# Patient Record
Sex: Female | Born: 1975 | Race: Black or African American | Hispanic: No | Smoking: Never smoker
Health system: Southern US, Community
[De-identification: ages and names within clinical notes are randomized; demographics above are authoritative.]

## PROBLEM LIST (undated history)

## (undated) DIAGNOSIS — I10 Essential (primary) hypertension: Secondary | ICD-10-CM

## (undated) DIAGNOSIS — F32A Depression, unspecified: Secondary | ICD-10-CM

## (undated) DIAGNOSIS — D649 Anemia, unspecified: Secondary | ICD-10-CM

---

## 1999-02-19 ENCOUNTER — Emergency Department (HOSPITAL_COMMUNITY): Admission: EM | Admit: 1999-02-19 | Discharge: 1999-02-19 | Payer: Self-pay | Admitting: Emergency Medicine

## 1999-02-19 ENCOUNTER — Encounter: Payer: Self-pay | Admitting: Emergency Medicine

## 2002-12-14 ENCOUNTER — Emergency Department (HOSPITAL_COMMUNITY): Admission: EM | Admit: 2002-12-14 | Discharge: 2002-12-14 | Payer: Self-pay | Admitting: Emergency Medicine

## 2004-05-03 ENCOUNTER — Emergency Department (HOSPITAL_COMMUNITY): Admission: EM | Admit: 2004-05-03 | Discharge: 2004-05-03 | Payer: Self-pay | Admitting: Family Medicine

## 2006-04-28 ENCOUNTER — Other Ambulatory Visit: Admission: RE | Admit: 2006-04-28 | Discharge: 2006-04-28 | Payer: Self-pay | Admitting: Obstetrics and Gynecology

## 2006-11-20 ENCOUNTER — Inpatient Hospital Stay (HOSPITAL_COMMUNITY): Admission: AD | Admit: 2006-11-20 | Discharge: 2006-11-23 | Payer: Self-pay | Admitting: Obstetrics and Gynecology

## 2012-05-03 ENCOUNTER — Encounter (HOSPITAL_COMMUNITY): Payer: Self-pay | Admitting: Emergency Medicine

## 2012-05-03 ENCOUNTER — Emergency Department (HOSPITAL_COMMUNITY)
Admission: EM | Admit: 2012-05-03 | Discharge: 2012-05-04 | Disposition: A | Discharge status: Home / Self Care | Payer: BC Managed Care – PPO | Attending: Emergency Medicine | Admitting: Emergency Medicine

## 2012-05-03 DIAGNOSIS — R21 Rash and other nonspecific skin eruption: Secondary | ICD-10-CM | POA: Insufficient documentation

## 2012-05-03 DIAGNOSIS — R0602 Shortness of breath: Secondary | ICD-10-CM | POA: Insufficient documentation

## 2012-05-03 DIAGNOSIS — T7840XA Allergy, unspecified, initial encounter: Secondary | ICD-10-CM

## 2012-05-03 MED ORDER — PREDNISONE 20 MG PO TABS: 40.0000 mg | ORAL_TABLET | Freq: Every day | ORAL | Status: DC

## 2012-05-03 MED ORDER — DIPHENHYDRAMINE HCL 25 MG PO TABS: 25.0000 mg | ORAL_TABLET | Freq: Four times a day (QID) | ORAL | Status: DC

## 2012-05-03 MED ORDER — METHYLPREDNISOLONE SODIUM SUCC 125 MG IJ SOLR
125.0000 mg | Freq: Once | INTRAMUSCULAR | Status: AC
  Administered 2012-05-03: 125 mg via INTRAVENOUS
  Filled 2012-05-03: qty 2

## 2012-05-03 MED ORDER — FAMOTIDINE IN NACL 20-0.9 MG/50ML-% IV SOLN
20.0000 mg | Freq: Once | INTRAVENOUS | Status: AC
  Administered 2012-05-03: 20 mg via INTRAVENOUS
  Filled 2012-05-03: qty 50

## 2012-05-03 MED ORDER — DIPHENHYDRAMINE HCL 50 MG/ML IJ SOLN
12.5000 mg | Freq: Once | INTRAMUSCULAR | Status: AC
  Administered 2012-05-03: 12.5 mg via INTRAVENOUS
  Filled 2012-05-03: qty 1

## 2012-05-03 MED ORDER — FAMOTIDINE 20 MG PO TABS: 20.0000 mg | ORAL_TABLET | Freq: Two times a day (BID) | ORAL | Status: DC

## 2012-05-03 NOTE — ED Provider Notes (Signed)
History     CSN: [id number]  Arrival date & time 05/03/12  2023   First MD Initiated Contact with Patient 05/03/12 2106      Chief Complaint  Patient presents with  . Allergic Reaction    (Consider location/radiation/quality/duration/timing/severity/associated sxs/prior treatment) Patient is a 36 y.o. female presenting with allergic reaction. The history is provided by the patient.  Allergic Reaction The primary symptoms are  shortness of breath and rash. The primary symptoms do not include abdominal pain, nausea, vomiting, dizziness or palpitations. The current episode started 3 to 5 hours ago. The problem has not changed since onset. The rash is associated with itching.  Significant symptoms also include eye redness and itching.  PT states this afternoon she felt feverish, states felt chills, and noted she had rash all over her body. States body feels itchy, states eyes are red, and now having shortness of breath and chest tightness. Took 25mg  of benadryl with no relief. States today started new medication , defuroxime. Also ate a cupcake with yellow icing shortly prior to onset of symptoms. Denies lip, tongue throat swelling.   History reviewed. No pertinent past medical history.  History reviewed. No pertinent past surgical history.  No family history on file.  History  Substance Use Topics  . Smoking status: Never Smoker   . Smokeless tobacco: Not on file  . Alcohol Use: No    OB History    Grav Para Term Preterm Abortions TAB SAB Ect Mult Living                  Review of Systems  Constitutional: Negative for fever and chills.  HENT: Negative for sore throat, trouble swallowing, neck pain and neck stiffness.   Eyes: Positive for redness.  Respiratory: Positive for chest tightness and shortness of breath.   Cardiovascular: Negative for palpitations and leg swelling.  Gastrointestinal: Negative for nausea, vomiting and abdominal pain.  Musculoskeletal: Positive for  myalgias.  Skin: Positive for itching and rash.  Neurological: Negative for dizziness, weakness and headaches.    Allergies  Amoxicillin and Cefuroxime  Home Medications   Current Outpatient Rx  Name Route Sig Dispense Refill  . DIPHENHYDRAMINE HCL 25 MG PO TABS Oral Take 25 mg by mouth every 6 (six) hours as needed. For allergy symptoms      BP 120/80  Pulse 81  Temp 98.9 F (37.2 C) (Oral)  Resp 18  SpO2 98%  LMP 02/18/2012  Physical Exam  Nursing note and vitals reviewed. Constitutional: She is oriented to person, place, and time. She appears well-developed and well-nourished.  HENT:  Head: Normocephalic and atraumatic.  Nose: Nose normal.  Mouth/Throat: Oropharynx is clear and moist.       Lips, tongue, uvula all normal, non edematous  Eyes: Conjunctivae are normal. Pupils are equal, round, and reactive to light.  Neck: Neck supple.  Cardiovascular: Normal rate, regular rhythm and normal heart sounds.   Pulmonary/Chest: Effort normal and breath sounds normal. No respiratory distress. She has no wheezes. She has no rales.  Abdominal: Soft. Bowel sounds are normal. She exhibits no distension. There is no tenderness. There is no rebound.  Neurological: She is alert and oriented to person, place, and time.  Skin: Skin is warm and dry.       Hives all over the body, mostly on thighs and upper arms.   Psychiatric: She has a normal mood and affect.    ED Course  Procedures (including critical care time)  Pt with urticharia and feels like her chest is tightening. Suspect an allergic reaction. Stop cefuroxime, benadryl, pepcid, solumedrol IV ordered.    11:41 PM Pt monitored for 2 hrs. No signs of worsening reaction. Pt feeling better. Rash almost completely resolved. Will continue prednisone, benadryl, pepcid at home. Close follow up.   1. Allergic reaction       MDM         Lottie Mussel, PA 05/03/12 2358

## 2012-05-03 NOTE — ED Notes (Addendum)
C/o feeling hot, hives, sob, and tingling in arms and face since 2:30pm today.  Took new Rx for Ceftin at 10am today for abscess. Speaking in complete sentences.

## 2012-05-03 NOTE — Discharge Instructions (Signed)
Avoid taking any cephalosporin medications in the future. Take benadryl, pepcid, prednisone as prescribed for the next 4 days. Follow up with your doctor if not improving. Return if symptoms worsening, develop swelling of tongue, lips, throat, or any new concerning symptoms.   Allergic Reaction, Mild to Moderate Allergies may happen from anything your body is sensitive to. This may be food, medications, pollens, chemicals, and nearly anything around you in everyday life that produces allergens. An allergen is anything that causes an allergy producing substance. Allergens cause your body to release allergic antibodies. Through a chain of events, they cause a release of histamine into the blood stream. Histamines are meant to protect you, but they also cause your discomfort. This is why antihistamines are often used for allergies. Heredity is often a factor in causing allergic reactions. This means you may have some of the same allergies as your parents. Allergies happen in all age groups. You may have some idea of what caused your reaction. There are many allergens around [place]. It may be difficult to know what caused your reaction. If this is a first time event, it may never happen again. Allergies cannot be cured but can be controlled with medications. SYMPTOMS  You may get some or all of the following problems from allergies.  Swelling and itching in and around the mouth.   Tearing, itchy eyes.   Nasal congestion and runny nose.   Sneezing and coughing.   An itchy red rash or hives.   Vomiting or diarrhea.   Difficulty breathing.  Seasonal allergies occur in all age groups. They are seasonal because they usually occur during the same season every year. They may be a reaction to molds, grass pollens, or tree pollens. Other causes of allergies are house dust mite allergens, pet dander and mold spores. These are just a common few of the thousands of allergens around [place]. All of the symptoms listed  above happen when you come in contact with pollens and other allergens. Seasonal allergies are usually not life threatening. They are generally more of a nuisance that can often be handled using medications. Hay fever is a combination of all or some of the above listed allergy problems. It may often be treated with simple over-the-counter medications such as diphenhydramine. Take medication as directed. Check with your caregiver or package insert for child dosages. TREATMENT AND HOME CARE INSTRUCTIONS If hives or rash are present:  Take medications as directed.   You may use an over-the-counter antihistamine (diphenhydramine) for hives and itching as needed. Do not drive or drink alcohol until medications used to treat the reaction have worn off. Antihistamines tend to make people sleepy.   Apply cold cloths (compresses) to the skin or take baths in cool water. This will help itching. Avoid hot baths or showers. Heat will make a rash and itching worse.   If your allergies persist and become more severe, and over the counter medications are not effective, there are many new medications your caretaker can prescribe. Immunotherapy or desensitizing injections can be used if all else fails. Follow up with your caregiver if problems continue.  SEEK MEDICAL CARE IF:   Your allergies are becoming progressively more troublesome.   You suspect a food allergy. Symptoms generally happen within 30 minutes of eating a food.   Your symptoms have not gone away within 2 days or are getting worse.   You develop new symptoms.   You want to retest yourself or your child with a food  or drink you think causes an allergic reaction. Never test yourself or your child of a suspected allergy without being under the watchful eye of your caregivers. A second exposure to an allergen may be life-threatening.  SEEK IMMEDIATE MEDICAL CARE IF:  You develop difficulty breathing or wheezing, or have a tight feeling in your  chest or throat.   You develop a swollen mouth, hives, swelling, or itching all over your body.  A severe reaction with any of the above problems should be considered life-threatening. If you suddenly develop difficulty breathing call for local emergency medical help. THIS IS AN EMERGENCY. MAKE SURE YOU:   Understand these instructions.   Will watch your condition.   Will get help right away if you are not doing well or get worse.  Document Released: 08/18/2007 Document Revised: 10/10/2011 Document Reviewed: 08/18/2007 [facility] Patient Information 2012 [place], [place].

## 2012-05-04 NOTE — ED Provider Notes (Signed)
Medical screening examination/treatment/procedure(s) were performed by non-physician practitioner and as supervising physician I was immediately available for consultation/collaboration.  Kebra Lowrimore K Linker, MD 05/04/12 1504 

## 2014-01-08 ENCOUNTER — Emergency Department (HOSPITAL_COMMUNITY)
Admission: EM | Admit: 2014-01-08 | Discharge: 2014-01-08 | Disposition: A | Discharge status: Home / Self Care | Payer: BC Managed Care – PPO | Source: Home / Self Care | Attending: Family Medicine | Admitting: Family Medicine

## 2014-01-08 ENCOUNTER — Encounter (HOSPITAL_COMMUNITY): Payer: Self-pay | Admitting: Emergency Medicine

## 2014-01-08 DIAGNOSIS — J40 Bronchitis, not specified as acute or chronic: Secondary | ICD-10-CM

## 2014-01-08 MED ORDER — HYDROCOD POLST-CHLORPHEN POLST 10-8 MG/5ML PO LQCR: 5.0000 mL | Freq: Every evening | ORAL | Status: DC | PRN

## 2014-01-08 MED ORDER — ALBUTEROL SULFATE HFA 108 (90 BASE) MCG/ACT IN AERS
INHALATION_SPRAY | RESPIRATORY_TRACT | Status: AC
  Filled 2014-01-08: qty 6.7

## 2014-01-08 MED ORDER — ALBUTEROL SULFATE HFA 108 (90 BASE) MCG/ACT IN AERS
2.0000 | INHALATION_SPRAY | Freq: Once | RESPIRATORY_TRACT | Status: AC
  Administered 2014-01-08: 2 via RESPIRATORY_TRACT

## 2014-01-08 MED ORDER — AZITHROMYCIN 250 MG PO TABS: ORAL_TABLET | ORAL | Status: DC

## 2014-01-08 NOTE — ED Provider Notes (Signed)
CSN: [id number]     Arrival date & time 01/08/14  1803 History   First MD Initiated Contact with Patient 01/08/14 1915     Chief Complaint  Patient presents with  . Sore Throat   Patient is a 38 y.o. female presenting with cough. The history is provided by the patient.  Cough Cough characteristics:  Harsh Severity:  Moderate Onset quality:  Gradual Progression:  Unchanged Chronicity:  New Smoker: no   Context: upper respiratory infection   Context: not animal exposure, not exposure to allergens, not fumes, not occupational exposure, not sick contacts, not smoke exposure, not weather changes and not with activity   Relieved by:  None tried Ineffective treatments:  None tried Associated symptoms: ear pain and sore throat   Associated symptoms: no chest pain, no chills, no diaphoresis, no ear fullness, no eye discharge, no fever, no headaches, no myalgias, no rash, no rhinorrhea, no shortness of breath, no sinus congestion, no weight loss and no wheezing   Risk factors: recent infection   Risk factors: no chemical exposure and no recent travel   Pt reports a persistent harsh cough since URI 3 weeks ago. URI resolved but cough persisted.Coughing episodes often associated with post-tussive vomiting. Cough worse at night.   Now pt w/ several days of increasing (R) sided throat irritation/pain and (R) ear pain.   History reviewed. No pertinent past medical history. History reviewed. No pertinent past surgical history. No family history on file. History  Substance Use Topics  . Smoking status: Never Smoker   . Smokeless tobacco: Not on file  . Alcohol Use: No   OB History   Grav Para Term Preterm Abortions TAB SAB Ect Mult Living                 Review of Systems  Constitutional: Negative.  Negative for fever, chills, weight loss and diaphoresis.  HENT: Positive for ear pain and sore throat. Negative for rhinorrhea.   Eyes: Negative.  Negative for discharge.  Respiratory: Positive  for cough. Negative for shortness of breath and wheezing.   Cardiovascular: Negative.  Negative for chest pain.  Gastrointestinal: Negative.   Endocrine: Negative.   Genitourinary: Negative.   Musculoskeletal: Negative.  Negative for myalgias.  Skin: Negative.  Negative for rash.  Allergic/Immunologic: Negative.   Neurological: Negative.  Negative for headaches.  Hematological: Negative.   Psychiatric/Behavioral: Negative.     Allergies  Amoxicillin and Cefuroxime  Home Medications   Current Outpatient Rx  Name  Route  Sig  Dispense  Refill  . guaiFENesin (ROBITUSSIN) 100 MG/5ML liquid   Oral   Take 200 mg by mouth 3 (three) times daily as needed for cough.         Marland Kitchen. azithromycin (ZITHROMAX Z-PAK) 250 MG tablet      Take 2 tabs on day 1 then 1 tab on days 2-5   6 tablet   0   . chlorpheniramine-HYDROcodone (TUSSIONEX PENNKINETIC ER) 10-8 MG/5ML LQCR   Oral   Take 5 mLs by mouth at bedtime as needed for cough.   50 mL   0   . diphenhydrAMINE (BENADRYL) 25 MG tablet   Oral   Take 25 mg by mouth every 6 (six) hours as needed. For allergy symptoms         . EXPIRED: diphenhydrAMINE (BENADRYL) 25 MG tablet   Oral   Take 1 tablet (25 mg total) by mouth every 6 (six) hours.   20 tablet   0   .  EXPIRED: famotidine (PEPCID) 20 MG tablet   Oral   Take 1 tablet (20 mg total) by mouth 2 (two) times daily.   30 tablet   0   . predniSONE (DELTASONE) 20 MG tablet   Oral   Take 2 tablets (40 mg total) by mouth daily.   8 tablet   0    BP 144/87  Pulse 83  Temp(Src) 98.6 F (37 C) (Oral)  Resp 18  SpO2 100%  LMP 12/29/2013 Physical Exam  Constitutional: She appears well-developed and well-nourished.  HENT:  Head: Normocephalic and atraumatic.  Right Ear: Tympanic membrane, external ear and ear canal normal.  Left Ear: Tympanic membrane, external ear and ear canal normal.  Nose: Nose normal.  Mouth/Throat: Uvula is midline and mucous membranes are normal. No  oropharyngeal exudate, posterior oropharyngeal edema, posterior oropharyngeal erythema or tonsillar abscesses.    ED Course  Procedures (including critical care time) Labs Review Labs Reviewed - No data to display Imaging Review No results found.   MDM   1. Bronchitis    Persistent cough s/p URI 3 wks ago. Cough often associated w/ post-tussive episodes. Now w/ (R) sided throat and ear pain. PE unremarkable. (R) face and ear pain felt related to persistent forceful coughing. Will treat for Bronchitis. Pt encouraged to arrange f/u w/ PCP if (R) neck and ear pain persist after cough resolves. Pt agreeable w/ plan.    Leanne Chang, NP 01/08/14 2158

## 2014-01-08 NOTE — ED Notes (Signed)
Reports onset approx 3 weeks ago.  initally had runny nose, cough.  Thought to be getting better, but cough never went away.  Now feels like "shards of glass"  In right side of throat and right ear.

## 2014-01-08 NOTE — Discharge Instructions (Signed)
There are no obvious signs of an ear or throat infection. Take the medications as directed. Use inhaler as instructed (2 puffs every 4-6 hours as needed for cough, shortness of breath or wheezing) and if the right sided throat, face and ear pain persist after your cough has resolved please arrange follow up with Dr Parke SimmersBland for further evaluation.   Bronchitis Bronchitis is swelling (inflammation) of the air tubes leading to your lungs (bronchi). This causes mucus and a cough. If the swelling gets bad, you may have trouble breathing. HOME CARE   Rest.  Drink enough fluids to keep your pee (urine) clear or pale yellow (unless you have a condition where you have to watch how much you drink).  Only take medicine as told by your doctor. If you were given antibiotic medicines, finish them even if you start to feel better.  Avoid smoke, irritating chemicals, and strong smells. These make the problem worse. Quit smoking if you smoke. This helps your lungs heal faster.  Use a cool mist humidifier. Change the water in the humidifier every day. You can also sit in the bathroom with hot shower running for 5 10 minutes. Keep the door closed.  See your health care provider as told.  Wash your hands often. GET HELP IF: Your problems do not get better after 1 week. GET HELP RIGHT AWAY IF:   Your fever gets worse.  You have chills.  Your chest hurts.  Your problems breathing get worse.  You have blood in your mucus.  You pass out (faint).  You feel lightheaded.  You have a bad headache.  You throw up (vomit) again and again. MAKE SURE YOU:  Understand these instructions.  Will watch your condition.  Will get help right away if you are not doing well or get worse. Document Released: 04/08/2008 Document Revised: 08/11/2013 Document Reviewed: 06/15/2013 [facility] Patient Information 2014 [place], [place].

## 2014-01-10 NOTE — ED Provider Notes (Signed)
Medical screening examination/treatment/procedure(s) were performed by resident physician or non-physician practitioner and as supervising physician I was immediately available for consultation/collaboration.   KINDL,JAMES DOUGLAS MD.   James D Kindl, MD 01/10/14 2032 

## 2014-01-28 ENCOUNTER — Encounter (HOSPITAL_COMMUNITY): Payer: Self-pay | Admitting: Emergency Medicine

## 2014-01-28 ENCOUNTER — Emergency Department (HOSPITAL_COMMUNITY)
Admission: EM | Admit: 2014-01-28 | Discharge: 2014-01-28 | Disposition: A | Discharge status: Home / Self Care | Payer: BC Managed Care – PPO | Attending: Emergency Medicine | Admitting: Emergency Medicine

## 2014-01-28 DIAGNOSIS — M62830 Muscle spasm of back: Secondary | ICD-10-CM

## 2014-01-28 DIAGNOSIS — M62838 Other muscle spasm: Secondary | ICD-10-CM | POA: Insufficient documentation

## 2014-01-28 MED ORDER — NAPROXEN 500 MG PO TABS: 500.0000 mg | ORAL_TABLET | Freq: Two times a day (BID) | ORAL | Status: DC

## 2014-01-28 NOTE — ED Provider Notes (Signed)
Medical screening examination/treatment/procedure(s) were performed by non-physician practitioner and as supervising physician I was immediately available for consultation/collaboration.   EKG Interpretation None        Suzi RootsKevin E Evie Croston, MD 01/28/14 [id number]

## 2014-01-28 NOTE — ED Notes (Addendum)
PT reports having gone to Zumba class and having some lumbosacral back pain the next day 3/25. Today she woke up and the pain was worse and reports very limited ROM of back (hard to get in car, can't put on shoes, etc). PT has been taking 800mg  ibuprofen q6h -last dose 0330 today. Reports that the ibuprofen only helps a little but had been allowing her to get through the day. Pain 2/10 if lying prone, but in any other position or with movement 9/10 pain.

## 2014-01-28 NOTE — ED Provider Notes (Signed)
CSN: [id number]     Arrival date & time 01/28/14  0712 History   First MD Initiated Contact with Patient 01/28/14 [phone]     Chief Complaint  Patient presents with  . Back Pain     (Consider location/radiation/quality/duration/timing/severity/associated sxs/prior Treatment) HPI Comments: Rita Laineatarsha A Haynes 38 year old female presents emergency Department with chief complaint of back pain.  Patient states that she went with him but class yesterday for the first time in 3 weeks after having not worked out because of an upper respiratory infection.  Patient states she experienced some soreness yesterday however when she woke up this morning she states that her pain was severe.  Patient complains that she could barely put on her shoes were closed today.  Pain is localized to the lumbar region, worse with twisting or bending.  Better when lying on her stomach or walking.  Denies weakness, loss of bowel/bladder function or saddle anesthesia. Denies neck stiffness, headache, rash.  Denies fever or recent procedures to back.  Patient denies any urinary symptoms.  Patient is a 38 y.o. female presenting with back pain. The history is provided by the patient. No language interpreter was used.  Back Pain Location:  Lumbar spine Quality:  Aching, stiffness and stabbing Stiffness is present:  All day Radiates to:  Does not radiate Pain severity now: severe at times. Onset quality:  Gradual Duration:  1 day Timing:  Constant Progression:  Worsening Relieved by:  Bed rest and NSAIDs Worsened by:  Bending and twisting Associated symptoms: no abdominal pain, no abdominal swelling, no bladder incontinence, no bowel incontinence, no chest pain, no dysuria, no fever, no headaches, no leg pain, no numbness, no paresthesias, no pelvic pain, no perianal numbness, no tingling, no weakness and no weight loss     History reviewed. No pertinent past medical history. History reviewed. No pertinent past surgical  history. No family history on file. History  Substance Use Topics  . Smoking status: Never Smoker   . Smokeless tobacco: Not on file  . Alcohol Use: No   OB History   Grav Para Term Preterm Abortions TAB SAB Ect Mult Living                 Review of Systems  Constitutional: Negative for fever and weight loss.  Cardiovascular: Negative for chest pain.  Gastrointestinal: Negative for abdominal pain and bowel incontinence.  Genitourinary: Negative for bladder incontinence, dysuria and pelvic pain.  Musculoskeletal: Positive for back pain.  Neurological: Negative for tingling, weakness, numbness, headaches and paresthesias.      Allergies  Amoxicillin and Cefuroxime  Home Medications   Current Outpatient Rx  Name  Route  Sig  Dispense  Refill  . azithromycin (ZITHROMAX Z-PAK) 250 MG tablet      Take 2 tabs on day 1 then 1 tab on days 2-5   6 tablet   0   . chlorpheniramine-HYDROcodone (TUSSIONEX PENNKINETIC ER) 10-8 MG/5ML LQCR   Oral   Take 5 mLs by mouth at bedtime as needed for cough.   50 mL   0   . diphenhydrAMINE (BENADRYL) 25 MG tablet   Oral   Take 25 mg by mouth every 6 (six) hours as needed. For allergy symptoms         . EXPIRED: diphenhydrAMINE (BENADRYL) 25 MG tablet   Oral   Take 1 tablet (25 mg total) by mouth every 6 (six) hours.   20 tablet   0   . EXPIRED: famotidine (PEPCID)  20 MG tablet   Oral   Take 1 tablet (20 mg total) by mouth 2 (two) times daily.   30 tablet   0   . guaiFENesin (ROBITUSSIN) 100 MG/5ML liquid   Oral   Take 200 mg by mouth 3 (three) times daily as needed for cough.         . predniSONE (DELTASONE) 20 MG tablet   Oral   Take 2 tablets (40 mg total) by mouth daily.   8 tablet   0    BP 130/84  Pulse 73  Temp(Src) 98.7 F (37.1 C) (Oral)  Resp 18  SpO2 100%  LMP 12/29/2013 Physical Exam  Constitutional: She is oriented to person, place, and time. She appears well-developed and well-nourished. No  distress.  HENT:  Head: Normocephalic and atraumatic.  Eyes: Conjunctivae are normal. No scleral icterus.  Neck: Normal range of motion.  Cardiovascular: Normal rate, regular rhythm and normal heart sounds.  Exam reveals no gallop and no friction rub.   No murmur heard. Pulmonary/Chest: Effort normal and breath sounds normal. No respiratory distress.  Abdominal: Soft. Bowel sounds are normal. She exhibits no distension and no mass. There is no tenderness. There is no guarding.  Musculoskeletal:       Back:  Neurological: She is alert and oriented to person, place, and time. She has normal reflexes.  Skin: Skin is warm and dry. No rash noted. She is not diaphoretic.    ED Course  Procedures (including critical care time) Labs Review Labs Reviewed - No data to display Imaging Review No results found.   EKG Interpretation None      MDM   Final diagnoses:  Back spasm    Patient with muscle spasm and trigger points located in the left lower lumbar paraspinals.  Tried gentle pressure to the trigger points easing trigger point therapy with dissipation of the spasm in the tissue and significant relief in the patient's symptoms.  On physical exam the patient had full range of motion as long as she moved slowly, no weakness, and normal DTRs.  Pain significantly decreased.  Patient will be given naproxen and and asked to alternate between heat and and cold therapy.  Also given her a referral to several local massage therapists.  Patient states she is feeling much better he agrees with plan of care.  Patient with back pain.  No neurological deficits and normal neuro exam.  Patient can walk but states is painful.  No loss of bowel or bladder control.  No concern for cauda equina.  No fever, night sweats, weight loss, h/o cancer, IVDU.  RICE protocol and pain medicine indicated and discussed with patient.       Arthor Captain, PA-C 01/28/14 [phone]

## 2014-01-28 NOTE — Discharge Instructions (Signed)
[facility], licensed acupuncture at [facility]  [phone] 9842 [address], [place], [place] [address] [phone]  Harvie HeckKeith Riva Eleanor Redding Charles Dick  At [facility] Sharalyn InkEnvy [place] [phone]  SEEK IMMEDIATE MEDICAL ATTENTION IF: New numbness, tingling, weakness, or problem with the use of your arms or legs.  Severe back pain not relieved with medications.  Change in bowel or bladder control.  Increasing pain in any areas of the body (such as chest or abdominal pain).  Shortness of breath, dizziness or fainting.  Nausea (feeling sick to your stomach), vomiting, fever, or sweats.  Back Pain, Adult Low back pain is very common. About 1 in 5 people have back pain.The cause of low back pain is rarely dangerous. The pain often gets better over time.About half of people with a sudden onset of back pain feel better in just 2 weeks. About 8 in 10 people feel better by 6 weeks.  CAUSES Some common causes of back pain include:  Strain of the muscles or ligaments supporting the spine.  Wear and tear (degeneration) of the spinal discs.  Arthritis.  Direct injury to the back. DIAGNOSIS Most of the time, the direct cause of low back pain is not known.However, back pain can be treated effectively even when the exact cause of the pain is unknown.Answering your caregiver's questions about your overall health and symptoms is one of the most accurate ways to make sure the cause of your pain is not dangerous. If your caregiver needs more information, he or she may order lab work or imaging tests (X-rays or MRIs).However, even if imaging tests show changes in your back, this usually does not require surgery. HOME CARE INSTRUCTIONS For many people, back pain returns.Since low back pain is rarely dangerous, it is often a condition that people can learn to [facility] their own.   Remain active. It is stressful on the back to sit or stand in one  place. Do not sit, drive, or stand in one place for more than 30 minutes at a time. Take short walks on level surfaces as soon as pain allows.Try to increase the length of time you walk each day.  Do not stay in bed.Resting more than 1 or 2 days can delay your recovery.  Do not avoid exercise or work.Your body is made to move.It is not dangerous to be active, even though your back may hurt.Your back will likely heal faster if you return to being active before your pain is gone.  Pay attention to your body when you bend and lift. Many people have less discomfortwhen lifting if they bend their knees, keep the load close to their bodies,and avoid twisting. Often, the most comfortable positions are those that put less stress on your recovering back.  Find a comfortable position to sleep. Use a firm mattress and lie on your side with your knees slightly bent. If you lie on your back, put a pillow under your knees.  Only take over-the-counter or prescription medicines as directed by your caregiver. Over-the-counter medicines to reduce pain and inflammation are often the most helpful.Your caregiver may prescribe muscle relaxant drugs.These medicines help dull your pain so you can more quickly return to your normal activities and healthy exercise.  Put ice on the injured area.  Put ice in a plastic bag.  Place a towel between your skin and the bag.  Leave the ice on for 15-20 minutes, 03-04 times a day for the first 2 to 3  days. After that, ice and heat may be alternated to reduce pain and spasms.  Ask your caregiver about trying back exercises and gentle massage. This may be of some benefit.  Avoid feeling anxious or stressed.Stress increases muscle tension and can worsen back pain.It is important to recognize when you are anxious or stressed and learn ways to manage it.Exercise is a great option. SEEK MEDICAL CARE IF:  You have pain that is not relieved with rest or medicine.  You  have pain that does not improve in 1 week.  You have new symptoms.  You are generally not feeling well. SEEK IMMEDIATE MEDICAL CARE IF:   You have pain that radiates from your back into your legs.  You develop new bowel or bladder control problems.  You have unusual weakness or numbness in your arms or legs.  You develop nausea or vomiting.  You develop abdominal pain.  You feel faint. Document Released: 10/21/2005 Document Revised: 04/21/2012 Document Reviewed: 03/11/2011 [facility] Patient Information 2014 [place], [place]. Cryotherapy Cryotherapy means treatment with cold. Ice or gel packs can be used to reduce both pain and swelling. Ice is the most helpful within the first 24 to 48 hours after an injury or flareup from overusing a muscle or joint. Sprains, strains, spasms, burning pain, shooting pain, and aches can all be eased with ice. Ice can also be used when recovering from surgery. Ice is effective, has very few side effects, and is safe for most people to use. PRECAUTIONS  Ice is not a safe treatment option for people with:  Raynaud's phenomenon. This is a condition affecting small blood vessels in the extremities. Exposure to cold may cause your problems to return.  Cold hypersensitivity. There are many forms of cold hypersensitivity, including:  Cold urticaria. Red, itchy hives appear on the skin when the tissues begin to warm after being iced.  Cold erythema. This is a red, itchy rash caused by exposure to cold.  Cold hemoglobinuria. Red blood cells break down when the tissues begin to warm after being iced. The hemoglobin that carry oxygen are passed into the urine because they cannot combine with blood proteins fast enough.  Numbness or altered sensitivity in the area being iced. If you have any of the following conditions, do not use ice until you have discussed cryotherapy with your caregiver:  Heart conditions, such as arrhythmia, angina, or chronic heart  disease.  High blood pressure.  Healing wounds or open skin in the area being iced.  Current infections.  Rheumatoid arthritis.  Poor circulation.  Diabetes. Ice slows the blood flow in the region it is applied. This is beneficial when trying to stop inflamed tissues from spreading irritating chemicals to surrounding tissues. However, if you expose your skin to cold temperatures for too long or without the proper protection, you can damage your skin or nerves. Watch for signs of skin damage due to cold. HOME CARE INSTRUCTIONS Follow these tips to use ice and cold packs safely.  Place a dry or damp towel between the ice and skin. A damp towel will cool the skin more quickly, so you may need to shorten the time that the ice is used.  For a more rapid response, add gentle compression to the ice.  Ice for no more than 10 to 20 minutes at a time. The bonier the area you are icing, the less time it will take to get the benefits of ice.  Check your skin after 5 minutes to make sure  there are no signs of a poor response to cold or skin damage.  Rest 20 minutes or more in between uses.  Once your skin is numb, you can end your treatment. You can test numbness by very lightly touching your skin. The touch should be so light that you do not see the skin dimple from the pressure of your fingertip. When using ice, most people will feel these normal sensations in this order: cold, burning, aching, and numbness.  Do not use ice on someone who cannot communicate their responses to pain, such as small children or people with dementia. HOW TO MAKE AN ICE PACK Ice packs are the most common way to use ice therapy. Other methods include ice massage, ice baths, and cryo-sprays. Muscle creams that cause a cold, tingly feeling do not offer the same benefits that ice offers and should not be used as a substitute unless recommended by your caregiver. To make an ice pack, do one of the following:  Place  crushed ice or a bag of frozen vegetables in a sealable plastic bag. Squeeze out the excess air. Place this bag inside another plastic bag. Slide the bag into a pillowcase or place a damp towel between your skin and the bag.  Mix 3 parts water with 1 part rubbing alcohol. Freeze the mixture in a sealable plastic bag. When you remove the mixture from the freezer, it will be slushy. Squeeze out the excess air. Place this bag inside another plastic bag. Slide the bag into a pillowcase or place a damp towel between your skin and the bag. SEEK MEDICAL CARE IF:  You develop white spots on your skin. This may give the skin a blotchy (mottled) appearance.  Your skin turns blue or pale.  Your skin becomes waxy or hard.  Your swelling gets worse. MAKE SURE YOU:   Understand these instructions.  Will watch your condition.  Will get help right away if you are not doing well or get worse. Document Released: 06/17/2011 Document Revised: 01/13/2012 Document Reviewed: 06/17/2011 [facility] Patient Information 2014 [place], [place]. Heat Therapy Heat therapy can help ease achy, tense, stiff, and tight muscles and joints. Heat should not be used on new injuries. Wait at least 48 hours after the injury before using heat therapy. Heat also should not be used for discomfort or pain that occurs right after doing an activity. If you still have pain or stiffness 3 hours after finishing the activity, then heat therapy may be used. PRECAUTIONS  High heat or prolonged exposure to heat can cause burns. Be careful when using heat therapy to avoid burning your skin. If you have any of the following conditions, do not use heat until you have discussed heat therapy with your caregiver:  Poor circulation.  Healing wounds or scarred skin in the area being treated.  Diabetes, heart disease, or high blood pressure.  Numbness of the area being treated.  Unusual swelling of the area being treated.  Active  infections.  Blood clots.  Cancer.  Inability to communicate your response to pain. This can include young children and people with dementia. HOME CARE INSTRUCTIONS Moist heat pack  Soak a clean towel in warm water, and squeeze out the extra water. The water temperature should be comfortable to the skin.  Put the warm, wet towel in a plastic bag.  Place a thin, dry towel between your skin and the bag.  Put the heat pack on the area for 5 minutes, and check your skin.  Your skin may be pink, but it should not be red.  Leave the heat pack on the area for a total of 15 to 30 minutes.  Repeat this every 2 to 4 hours while awake. Do not use heat while you are sleeping. Warm water bath  Fill a tub with warm water. The water temperature should be comfortable to the skin.  Place the affected body part in the tub.  Soak the area for 20 to 40 minutes.  Repeat as needed. Hot water bottle  Fill the water bottle half full with hot water.  Press out the extra air. Close the cap tightly.  Place a dry towel between your skin and the bottle.  Put the bottle on the area for 5 minutes, and check your skin. Your skin may be pink, but it should not be red.  Leave the bottle on the area for a total of 15 to 30 minutes.  Repeat this every 2 to 4 hours while awake. Electric heating pad  Place a dry towel between your skin and the heating pad.  Set the heating pad on low heat.  Put the heating pad on the area for 10 minutes, and check your skin. Your skin may be pink, but it should not be red.  Leave the heating pad on the area for a total of 20 to 40 minutes.  Repeat this every 2 to 4 hours while awake.  Do not lie on the heating pad.  Do not fall asleep while using the heating pad.  Do not use the heating pad near water. Contact with water can result in an electrical shock. SEEK MEDICAL CARE IF:  You have blisters, redness, swelling, or numbness.  You have any new  problems.  Your problems are getting worse.  You have any questions or concerns. If you develop any problems, stop using heat therapy until you see your caregiver. MAKE SURE YOU:  Understand these instructions.  Will watch your condition.  Will get help right away if you are not doing well or get worse. Document Released: 01/13/2012 Document Reviewed: 01/13/2012 [facility] Patient Information 2014 [place], [place].

## 2016-04-11 ENCOUNTER — Emergency Department (HOSPITAL_COMMUNITY)
Admission: EM | Admit: 2016-04-11 | Discharge: 2016-04-11 | Disposition: A | Discharge status: Home / Self Care | Payer: BC Managed Care – PPO | Attending: Emergency Medicine | Admitting: Emergency Medicine

## 2016-04-11 ENCOUNTER — Encounter (HOSPITAL_COMMUNITY): Payer: Self-pay | Admitting: Emergency Medicine

## 2016-04-11 DIAGNOSIS — L02411 Cutaneous abscess of right axilla: Secondary | ICD-10-CM | POA: Diagnosis not present

## 2016-04-11 DIAGNOSIS — Z791 Long term (current) use of non-steroidal anti-inflammatories (NSAID): Secondary | ICD-10-CM | POA: Diagnosis not present

## 2016-04-11 DIAGNOSIS — I10 Essential (primary) hypertension: Secondary | ICD-10-CM | POA: Insufficient documentation

## 2016-04-11 DIAGNOSIS — Z79899 Other long term (current) drug therapy: Secondary | ICD-10-CM | POA: Insufficient documentation

## 2016-04-11 HISTORY — DX: Essential (primary) hypertension: I10

## 2016-04-11 MED ORDER — LIDOCAINE HCL (PF) 1 % IJ SOLN
5.0000 mL | Freq: Once | INTRAMUSCULAR | Status: AC
  Administered 2016-04-11: 5 mL
  Filled 2016-04-11: qty 5

## 2016-04-11 MED ORDER — SULFAMETHOXAZOLE-TRIMETHOPRIM 800-160 MG PO TABS
1.0000 | ORAL_TABLET | Freq: Once | ORAL | Status: AC
  Administered 2016-04-11: 1 via ORAL
  Filled 2016-04-11: qty 1

## 2016-04-11 MED ORDER — SULFAMETHOXAZOLE-TRIMETHOPRIM 800-160 MG PO TABS: 1.0000 | ORAL_TABLET | Freq: Two times a day (BID) | ORAL | Status: AC

## 2016-04-11 NOTE — ED Provider Notes (Signed)
CSN: [id number]     Arrival date & time 04/11/16  1127 History  By signing my name below, I, Rita Haynes, attest that this documentation has been prepared under the direction and in the presence of Rita BuffaloHope Azariya Freeman, NP. Electronically Signed: Tanda RockersMargaux Haynes, ED Scribe. 04/11/2016. 1:20 PM.   Chief Complaint  Patient presents with  . Abscess   Patient is a 40 y.o. female presenting with abscess. The history is provided by the patient. No language interpreter was used.  Abscess Location:  Shoulder/arm Shoulder/arm abscess location:  R axilla Size:  4 cm Abscess quality: fluctuance, painful and redness   Red streaking: no   Duration:  4 days Progression:  Worsening Pain details:    Quality:  Unable to specify   Severity:  Moderate   Duration:  4 days   Timing:  Constant   Progression:  Worsening Chronicity:  New Context: not diabetes   Relieved by:  Nothing Ineffective treatments:  Warm compresses (hydrocodone) Associated symptoms: no fever   Risk factors: prior abscess     HPI Comments: Rita Haynes is a 40 y.o. female who presents to the Emergency Department complaining of gradual onset, constant, area of redness, swelling, and pain to right axilla x 2 days. Pt reports that she has had a knot to the area for the past 2 weeks but it did not begin increasing in size and becoming painful until 2 days ago.  She has been applying warm compresses without relief. Pt also took Hydrocodone last night which is prescribed for her back pain without relief. She has hx of similar symptoms with abscesses in the past. Denies fever, chills, or any other associated symptoms.   Past Medical History  Diagnosis Date  . Hypertension    History reviewed. No pertinent past surgical history. No family history on file. Social History  Substance Use Topics  . Smoking status: Never Smoker   . Smokeless tobacco: None  . Alcohol Use: No   OB History    No data available     Review of Systems   Constitutional: Negative for fever and chills.  Skin: Positive for color change and wound.  All other systems reviewed and are negative.  Allergies  Amoxicillin and Cefuroxime  Home Medications   Prior to Admission medications   Medication Sig Start Date End Date Taking? Authorizing Provider  montelukast (SINGULAIR) 10 MG tablet Take 10 mg by mouth at bedtime.    Historical Provider, MD  naproxen (NAPROSYN) 500 MG tablet Take 1 tablet (500 mg total) by mouth 2 (two) times daily with a meal. 01/28/14   Arthor CaptainAbigail Harris, PA-C  sulfamethoxazole-trimethoprim (BACTRIM DS,SEPTRA DS) 800-160 MG tablet Take 1 tablet by mouth 2 (two) times daily. 04/11/16 04/18/16  Rita Haynes Rita OchM Jovon Streetman, NP   BP 136/87 mmHg  Pulse 76  Temp(Src) 98.8 F (37.1 C) (Oral)  Resp 16  SpO2 100%  LMP 03/20/2016   Physical Exam  Constitutional: She is oriented to person, place, and time. She appears well-developed and well-nourished. No distress.  HENT:  Head: Normocephalic and atraumatic.  Eyes: Conjunctivae and EOM are normal.  Neck: Neck supple. No tracheal deviation present.  Pea sized posterior cervical lymph node on the left, non tender.   Cardiovascular: Normal rate.   Pulmonary/Chest: Effort normal. No respiratory distress.  Musculoskeletal: Normal range of motion.  Lymphadenopathy:    She has cervical adenopathy.  Neurological: She is alert and oriented to person, place, and time.  Skin: Skin is warm and  dry.  4 cm raised tender area with centralized fluctuance. There is mild erythema.no red streaking noted. No axillary nodes palpated.   Psychiatric: She has a normal mood and affect. Her behavior is normal.  Nursing note and vitals reviewed.   ED Course  Procedures (including critical care time)  INCISION AND DRAINAGE PROCEDURE NOTE: Patient identification was confirmed and verbal consent was obtained. This procedure was performed by Rita Buffalo, NP at 1:27 PM. Site: Right axilla Sterile procedures  observed Needle size: 27 guage Anesthetic used (type and amt): 3 CCs 1% Lidocaine Blade size: 11 Drainage: Large amount of purulent drainage Complexity: Complex Site anesthetized, single straight incision made over site, wound drained and explored loculations, rinsed with copious amounts of normal saline, covered with dry, sterile dressing.  Pt tolerated procedure well without complications.  Instructions for care discussed verbally and pt provided with additional written instructions for homecare and f/u.   DIAGNOSTIC STUDIES: Oxygen Saturation is 100% on RA, normal by my interpretation.    COORDINATION OF CARE: 1:15 PM-Discussed treatment plan which includes I&D with pt at bedside and pt agreed to plan.   MDM   Final diagnoses:  Abscess of right axilla    Patient with skin abscess. Incision and drainage performed in the ED today.  Abscess was not large enough to warrant packing or drain placement. Wound recheck as needed. Supportive care and return precautions discussed.  Pt sent home with Bactrim DS. The patient appears reasonably screened and/or stabilized for discharge and I doubt any other emergent medical condition requiring further screening, evaluation, or treatment in the ED prior to discharge. Discussed with patient to small cervical node that she questioned and she will f/u with her PCP so he can continue to monitor the node.   I personally performed the services described in this documentation, which was scribed in my presence. The recorded information has been reviewed and is accurate.      [place], [place] 04/12/16 1147  Geoffery Lyons, MD 04/12/16 1314

## 2016-04-11 NOTE — ED Notes (Signed)
Abscess under right arm, has hx-- states usually dr gives her an antibiotic and goes away. Reddened area under right arm.

## 2016-04-11 NOTE — ED Notes (Signed)
Declined W/C at D/C and was escorted to lobby by RN. 

## 2016-04-11 NOTE — Discharge Instructions (Signed)
Take your pain medication you have at home and take ibuprofen as needed for pain. Follow up with your doctor or return here as needed.  Apply warm wet compresses to the area several times a day.

## 2016-05-13 ENCOUNTER — Emergency Department (HOSPITAL_COMMUNITY): Payer: BC Managed Care – PPO

## 2016-05-13 ENCOUNTER — Encounter (HOSPITAL_COMMUNITY): Payer: Self-pay | Admitting: *Deleted

## 2016-05-13 ENCOUNTER — Emergency Department (HOSPITAL_COMMUNITY)
Admission: EM | Admit: 2016-05-13 | Discharge: 2016-05-13 | Disposition: A | Discharge status: Home / Self Care | Payer: BC Managed Care – PPO | Attending: Emergency Medicine | Admitting: Emergency Medicine

## 2016-05-13 DIAGNOSIS — M4806 Spinal stenosis, lumbar region: Secondary | ICD-10-CM | POA: Insufficient documentation

## 2016-05-13 DIAGNOSIS — M48061 Spinal stenosis, lumbar region without neurogenic claudication: Secondary | ICD-10-CM

## 2016-05-13 DIAGNOSIS — M545 Low back pain, unspecified: Secondary | ICD-10-CM

## 2016-05-13 DIAGNOSIS — I10 Essential (primary) hypertension: Secondary | ICD-10-CM | POA: Insufficient documentation

## 2016-05-13 LAB — POC URINE PREG, ED: Preg Test, Ur: NEGATIVE

## 2016-05-13 MED ORDER — OXYCODONE-ACETAMINOPHEN 5-325 MG PO TABS
1.0000 | ORAL_TABLET | Freq: Once | ORAL | Status: AC
  Administered 2016-05-13: 1 via ORAL
  Filled 2016-05-13: qty 1

## 2016-05-13 MED ORDER — CYCLOBENZAPRINE HCL 10 MG PO TABS
5.0000 mg | ORAL_TABLET | Freq: Once | ORAL | Status: AC
  Administered 2016-05-13: 5 mg via ORAL
  Filled 2016-05-13: qty 1

## 2016-05-13 MED ORDER — KETOROLAC TROMETHAMINE 30 MG/ML IJ SOLN: 30.0000 mg | Freq: Once | INTRAMUSCULAR | Status: DC

## 2016-05-13 MED ORDER — HYDROCODONE-ACETAMINOPHEN 5-325 MG PO TABS: 1.0000 | ORAL_TABLET | ORAL | Status: DC | PRN

## 2016-05-13 MED ORDER — NAPROXEN 500 MG PO TABS: 500.0000 mg | ORAL_TABLET | Freq: Two times a day (BID) | ORAL | Status: DC

## 2016-05-13 MED ORDER — KETOROLAC TROMETHAMINE 30 MG/ML IJ SOLN
30.0000 mg | Freq: Once | INTRAMUSCULAR | Status: AC
  Administered 2016-05-13: 30 mg via INTRAMUSCULAR
  Filled 2016-05-13: qty 1

## 2016-05-13 MED ORDER — MORPHINE SULFATE (PF) 4 MG/ML IV SOLN: 4.0000 mg | Freq: Once | INTRAVENOUS | Status: DC

## 2016-05-13 MED ORDER — DIAZEPAM 5 MG/ML IJ SOLN: 5.0000 mg | Freq: Once | INTRAMUSCULAR | Status: DC

## 2016-05-13 MED ORDER — NAPROXEN 250 MG PO TABS
500.0000 mg | ORAL_TABLET | Freq: Once | ORAL | Status: AC
  Administered 2016-05-13: 500 mg via ORAL
  Filled 2016-05-13: qty 2

## 2016-05-13 MED ORDER — CYCLOBENZAPRINE HCL 10 MG PO TABS: ORAL_TABLET | ORAL | Status: DC

## 2016-05-13 MED ORDER — DIAZEPAM 5 MG/ML IJ SOLN
5.0000 mg | Freq: Once | INTRAMUSCULAR | Status: AC
  Administered 2016-05-13: 5 mg via INTRAMUSCULAR
  Filled 2016-05-13: qty 2

## 2016-05-13 NOTE — Discharge Instructions (Signed)
Spinal Stenosis Spinal stenosis is an abnormal narrowing of the canals of your spine (vertebrae). CAUSES  Spinal stenosis is caused by areas of bone pushing into the central canals of your vertebrae. This condition can be present at birth (congenital). It also may be caused by arthritic deterioration of your vertebrae (spinal degeneration).  SYMPTOMS   Pain that is generally worse with activities, particularly standing and walking.  Numbness, tingling, hot or cold sensations, weakness, or weariness in your legs.  Frequent episodes of falling.  A foot-slapping gait that leads to muscle weakness. DIAGNOSIS  Spinal stenosis is diagnosed with the use of magnetic resonance imaging (MRI) or computed tomography (CT). TREATMENT  Initial therapy for spinal stenosis focuses on the management of the pain and other symptoms associated with the condition. These therapies include:  Practicing postural changes to lessen pressure on your nerves.  Exercises to strengthen the core of your body.  Loss of excess body weight.  The use of nonsteroidal anti-inflammatory medicines to reduce swelling and inflammation in your nerves. When therapies to manage pain are not successful, surgery to treat spinal stenosis may be recommended. This surgery involves removing excess bone, which puts pressure on your nerve roots. During this surgery (laminectomy), the posterior boney arch (lamina) and excess bone around the facet joints are removed.   This information is not intended to replace advice given to you by your health care provider. Make sure you discuss any questions you have with your health care provider.   Document Released: 01/11/2004 Document Revised: 11/11/2014 Document Reviewed: 01/29/2013 [employer] Interactive Patient Education 2016 [employer]  

## 2016-05-13 NOTE — ED Provider Notes (Signed)
CSN: [id number]     Arrival date & time 05/13/16  4098 History   First MD Initiated Contact with Patient 05/13/16 [phone]     Chief Complaint  Patient presents with  . Back Pain     (Consider location/radiation/quality/duration/timing/severity/associated sxs/prior Treatment) HPI Rita Haynes is a 40 y.o. female with PMH significant for HTN who presents with sudden onset, severe, constant low back pain since this morning.  Describes it as "pressure" and will ocassionally radiate down right and left legs, stopping at the knees. Patient reports calling EMS due to inability to get out of the bed this morning.  States her back has been giving her trouble for about 3 months.  She states she has been seeing a chiropractor with the last session 2 weeks ago.  Worse with movement or standing for long periods of time.  No fever, chills, abdominal pain, urinary symptoms, b/b incontinence, IVDU, numbness, or weakness.  Has taken motrin and applied heat without relief.  She is ambulatory, but states she feels like she is dragging her left foot.   Past Medical History  Diagnosis Date  . Hypertension    History reviewed. No pertinent past surgical history. History reviewed. No pertinent family history. Social History  Substance Use Topics  . Smoking status: Never Smoker   . Smokeless tobacco: None  . Alcohol Use: No   OB History    No data available     Review of Systems All other systems negative unless otherwise stated in HPI    Allergies  Amoxicillin and Cefuroxime  Home Medications   Prior to Admission medications   Medication Sig Start Date End Date Taking? Authorizing Provider  cyclobenzaprine (FLEXERIL) 10 MG tablet Take 0.5 tablet-1 tablet TID PRN 05/13/16   Cheri Fowler, PA-C  HYDROcodone-acetaminophen (NORCO/VICODIN) 5-325 MG tablet Take 1 tablet by mouth every 4 (four) hours as needed. 05/13/16   Skyeler Smola, PA-C  montelukast (SINGULAIR) 10 MG tablet Take 10 mg by mouth at  bedtime.    Historical Provider, MD  naproxen (NAPROSYN) 500 MG tablet Take 1 tablet (500 mg total) by mouth 2 (two) times daily. 05/13/16   Laniah Grimm, PA-C   BP 126/69 mmHg  Pulse 72  Temp(Src) 98 F (36.7 C) (Oral)  Resp 18  Ht  (1.702 m)  Wt 101.152 kg  BMI 34.92 kg/m2  SpO2 99%  LMP 04/21/2016 Physical Exam  Constitutional: She is oriented to person, place, and time. She appears well-developed and well-nourished.  HENT:  Head: Atraumatic.  Eyes: Conjunctivae are normal.  Cardiovascular: Normal rate, regular rhythm, normal heart sounds and intact distal pulses.   Pulses:      Dorsalis pedis pulses are 2+ on the right side, and 2+ on the left side.  Pulmonary/Chest: Effort normal and breath sounds normal.  Abdominal: Soft. Bowel sounds are normal. She exhibits no distension. There is no tenderness.  Musculoskeletal: She exhibits tenderness.       Lumbar back: She exhibits decreased range of motion, tenderness and bony tenderness.       Back:  Lumbar spinous process tenderness.  No step offs. No crepitus.  Neurological: She is alert and oriented to person, place, and time.  Reflex Scores:      Patellar reflexes are 2+ on the right side and 2+ on the left side.      Achilles reflexes are 2+ on the right side and 2+ on the left side. No saddle anesthesia. 5/5 strength bilaterally in hip, knees,  ankles, and great toes (Left slightly decreased than the right).  Sensation intact bilaterally throughout lower extremities. No saddle anesthesia.  +SLR bilaterally. Unsteady gait due to pain and inability to stand straight.   Skin: Skin is warm and dry.  Psychiatric: She has a normal mood and affect. Her behavior is normal.    ED Course  Procedures (including critical care time) Labs Review Labs Reviewed  POC URINE PREG, ED    Imaging Review Mr Lumbar Spine Wo Contrast  05/13/2016  CLINICAL DATA:  Low back pain.  No history of prior back surgery EXAM: MRI LUMBAR SPINE WITHOUT  CONTRAST TECHNIQUE: Multiplanar, multisequence MR imaging of the lumbar spine was performed. No intravenous contrast was administered. COMPARISON:  None. FINDINGS: Segmentation:  Normal Alignment:  Normal Vertebrae: Negative for fracture or mass lesion. Mild fatty bony marrow changes at L4-5 due to disc degeneration Conus medullaris: Extends to the L1-2 level and appears normal. Paraspinal and other soft tissues: Paraspinous muscles are symmetric and normal. No retroperitoneal mass or adenopathy. Disc levels: L1-2: Negative L2-3:  Negative L3-4:  Negative L4-5: Large extruded disc fragment in the epidural space compressing the thecal sac causing severe spinal stenosis. Disc degeneration with disc space narrowing and fatty changes in the endplates. Mild facet degeneration. L5-S1:  Negative IMPRESSION: Large extruded disc fragment L4-5 compressing the thecal sac and causing severe spinal stenosis. Other levels are normal. Electronically Signed   By: Marlan Palauharles  Clark M.D.   On: 05/13/2016 11:16   I have personally reviewed and evaluated these images and lab results as part of my medical decision-making.   EKG Interpretation None      MDM   Final diagnoses:  Low back pain  Spinal stenosis at L4-L5 level   Patient presents with back pain.  No b/b incontinence, saddle anesthesia, or numbness.  Exquisite tenderness along l spine and paraspinal musculature.  Normal strength; however, slightly decreased on the left.  Normal sensation.  Unsteady gait due to pain.  IM Valium, Toradol, and PO percocet ordered.  Given exquisite midline tenderness, decreased strength in LLE, and inability to ambulate safely will obtain lumbar MR for further evaluation.  She is not pregnant. MR shows Large extruded disc fragment at L4-L5 compressing the thecal sac and causing severe spinal stenosis. Patient seen by Dr. Newell CoralNudelman. Scheduled for surgery July 19. Discharge home with Norco, Flexeril, and naproxen. Return precautions  discussed. Patient agrees and acknowledges the above plan for discharge.  I personally performed the services described in this documentation, which was scribed in my presence. The recorded information has been reviewed and is accurate.    Cheri FowlerKayla Karsyn Rochin, PA-C 05/13/16 1351  Tilden FossaElizabeth Rees, MD 05/14/16 [id number]

## 2016-05-13 NOTE — Consult Note (Signed)
Reason for Consult:  L4-L5 lumbar disc herniation Referring Physician:  Cheri FowlerKayla Rose, PA  Cloria SpringNatarsha A Malen GauzeFoster is an 40 y.o. black female.  HPI: Patient presented to the emergency room today after having had further worsening of ongoing low back and bilateral lumbar radicular pain. Her history dates back at least 2 years when she developed low back pain with pain extending primarily into the left buttock and posterior 5. She wondered whether it may have been brought on by Zumba.  She saw her primary physician and referred to a chiropractor. She worked with the chiropractor 2 or 3 times a week for 4-5 weeks and improved. After she improved she worked on weight loss, and lost about 35 pounds. She did well for almost 2 years, but again began to develop increasing symptoms 3-4 months ago. She been having difficulties with low back pain, and pain radiating into the lower extremities bilaterally, worse on the left than the right, typically through the buttock and posterior thigh. She does not describe any numbness, paresthesias, or weakness. However she does tend to walk with a limp, favoring the left lower extremity.  The patient returned to the chiropractor, and they worked on a variety of stretches, but the pain has persistented. She finds it to be particularly aggravated by sitting and driving. She'll experience the pain at night as well as during the day, and she awakens with the pain.  Because of the severity of pain this morning and the inability to get up and out of bed, she called EMS, and [facility]. She was evaluated by [name]. MRI of the lumbar spine was done without contrast, the revealed advanced degenerative disc disease at the L4-L5 level with a large bilateral L4-5 lumbar disc herniation, corresponding to her low back pain and bilateral lumbar radiculopathy. Notably the other lumbar levels are unremarkable including L1-2, L2-3, L3-4, and  L5-S1. Neurosurgical consultation was requested by [name].  Patient has used over-the-counter medications including Bayer, ibuprofen, Tylenol, etc. In the emergency room she's been given Valium and Percocet, and the pain has eased.   Past Medical History:  Past Medical History  Diagnosis Date  . Hypertension   No history of myocardial infarction, cancer, stroke, diabetes, peptic ulcer disease, or lung disease.  Past Surgical History:  Previous surgeries include some oral surgery, and I&D of a axillary abscess   Family History: Both her parents are living, her mother does have a history of hypertension.  Social History: Patient is married. She has 3 children. 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 She doesn't smoke, she drinks an alcoholic beverage about once a month, she does not describe a history of substance abuse  Allergies:  Allergies  Allergen Reactions  . Amoxicillin Hives  . Cefuroxime Hives and Nausea And Vomiting    Chest tightness    Medications: I have reviewed the patient's current medications.  ROS:  Notable for those difficulties described in the history of present illness and past medical history, but is otherwise unremarkable.  Physical Examination: Patient well-developed well-nourished black female in no acute distress. Blood pressure 132/87, pulse 60, temperature 98.3 F (36.8 C), temperature source Oral, resp. rate 16, height 5\' 7"  (1.702 m), weight 101.152 kg (223 lb), last menstrual period 04/21/2016, SpO2 100 %. Lungs:  Clear to auscultation, symmetrical respiratory excursion. Heart:  Regular rate and rhythm, normal S1, S2.  No murmurs heard. Abdomen:  Soft, nondistended,  bowel sounds present. Extremity:  No clubbing, cyanosis, or edema. Musculoskeletal:  No tenderness to palpation over the lumbar spinous processes or paralumbar muscle culture. Straight leg raising is positive bilaterally at 20.  Neurological  Examination: Mental Status Examination:  Awake and alert, oriented. Speech is fluent. Good comprehension. Motor Examination:  Strength is 5/5 in the lower extremities including the iliopsoas, quadriceps, dorsiflexor, EHL, and plantar flexor bilaterally. Sensory Examination:  Intact pinprick in the thighs, legs, and feet bilaterally. Reflex Examination:   2+ in the quadriceps and gastrocnemius bilaterally. Toes downgoing bilaterally.   Results for orders placed or performed during the hospital encounter of 05/13/16 (from the past 48 hour(s))  POC Urine Pregnancy, ED (do NOT order at [facility])     Status: None   Collection Time: 05/13/16  8:44 AM  Result Value Ref Range   Preg Test, Ur NEGATIVE NEGATIVE    Comment:        THE SENSITIVITY OF THIS METHODOLOGY IS >24 mIU/mL     Mr Lumbar Spine Wo Contrast  05/13/2016  CLINICAL DATA:  Low back pain.  No history of prior back surgery EXAM: MRI LUMBAR SPINE WITHOUT CONTRAST TECHNIQUE: Multiplanar, multisequence MR imaging of the lumbar spine was performed. No intravenous contrast was administered. COMPARISON:  None. FINDINGS: Segmentation:  Normal Alignment:  Normal Vertebrae: Negative for fracture or mass lesion. Mild fatty bony marrow changes at L4-5 due to disc degeneration Conus medullaris: Extends to the L1-2 level and appears normal. Paraspinal and other soft tissues: Paraspinous muscles are symmetric and normal. No retroperitoneal mass or adenopathy. Disc levels: L1-2: Negative L2-3:  Negative L3-4:  Negative L4-5: Large extruded disc fragment in the epidural space compressing the thecal sac causing severe spinal stenosis. Disc degeneration with disc space narrowing and fatty changes in the endplates. Mild facet degeneration. L5-S1:  Negative IMPRESSION: Large extruded disc fragment L4-5 compressing the thecal sac and causing severe spinal stenosis. Other levels are normal. Electronically Signed   By: Marlan Palau M.D.   On: 05/13/2016 11:16      Assessment/Plan: Patient presenting to the emergency room with low back and bilateral lumbar radicular pain, left worse than right. Examination shows intact strength and sensation. MRI of the lumbar spine without contrast reveals a large bilateral L4-5 lumbar disc herniation, with significant thecal sac and nerve root compression bilaterally. I discussed the patient's condition, neurologic findings, and MRI findings at length with the patient, her older daughter, and her son, and had an opportunity to review her MRI images with them.    We discussed options for further treatment and care, ranging from further symptomatic treatment versus possible surgical intervention, specifically a bilateral L4-5 lumbar laminotomy and microdiscectomy. We discussed the nature of surgery and postoperative course, and risk surgical risk of infection, bleeding, possible need for transfusion, the risk of nerve root dysfunction with pain, weakness, numbness, and/or tingling, the risk of dural tear and CSF leakage and possible need for the surgical risk of recurrent disc herniation and the possible need for further surgery, and anesthetic risks including myocardial infarction, stroke, pneumonia, and death.   We discussed the option of admission to the hospital and proceeding with surgery later today versus discharge to home and scheduling surgery on an elective basis. We discussed the option of outpatient surgery at [facility] specialty surgical center.  The patient's preference is to proceed with surgery on an elective basis, and I discussed the situation with my scheduling staff and we will hopefully be able to  schedule the patient for surgery on Wednesday, July 19 at [facility].  I discussed the situation with Ms. Haynes who is going to facilitate the patient's discharge from the emergency room, and is going to provide the patient with prescriptions for Flexeril, Naprosyn, and Norco 5/325 to tide her over until we go ahead with  surgery next week. I recommend the patient that she used Flexeril as needed for muscle spasms, taking the Naprosyn on a scheduled basis, and limiting as much as feasible use of Norco.   Hewitt Shorts, MD 05/13/2016, 2:06 PM

## 2016-05-13 NOTE — ED Notes (Signed)
MD at bedside.neuro consult

## 2016-05-13 NOTE — ED Notes (Signed)
Pt reported worse back pain this AM. Pt called EMS to get her out of bed and assist her to her Daughters car. Pt reports pain is in lower back. Pt reports recent treatment from a chiropractor for back pain.

## 2018-10-15 ENCOUNTER — Encounter (HOSPITAL_COMMUNITY): Payer: Self-pay | Admitting: Emergency Medicine

## 2018-10-15 ENCOUNTER — Emergency Department (HOSPITAL_COMMUNITY)
Admission: EM | Admit: 2018-10-15 | Discharge: 2018-10-15 | Disposition: A | Discharge status: Home / Self Care | Payer: BC Managed Care – PPO | Attending: Emergency Medicine | Admitting: Emergency Medicine

## 2018-10-15 DIAGNOSIS — B349 Viral infection, unspecified: Secondary | ICD-10-CM | POA: Insufficient documentation

## 2018-10-15 DIAGNOSIS — I10 Essential (primary) hypertension: Secondary | ICD-10-CM | POA: Diagnosis not present

## 2018-10-15 DIAGNOSIS — R509 Fever, unspecified: Secondary | ICD-10-CM | POA: Diagnosis present

## 2018-10-15 DIAGNOSIS — Z79899 Other long term (current) drug therapy: Secondary | ICD-10-CM | POA: Diagnosis not present

## 2018-10-15 LAB — COMPREHENSIVE METABOLIC PANEL
ALK PHOS: 53 U/L (ref 38–126)
ALT: 10 U/L (ref 0–44)
ANION GAP: 6 (ref 5–15)
AST: 19 U/L (ref 15–41)
Albumin: 3.2 g/dL — ABNORMAL LOW (ref 3.5–5.0)
BILIRUBIN TOTAL: 0.6 mg/dL (ref 0.3–1.2)
BUN: 17 mg/dL (ref 6–20)
CO2: 21 mmol/L — ABNORMAL LOW (ref 22–32)
Calcium: 8.5 mg/dL — ABNORMAL LOW (ref 8.9–10.3)
Chloride: 104 mmol/L (ref 98–111)
Creatinine, Ser: 1.31 mg/dL — ABNORMAL HIGH (ref 0.44–1.00)
GFR calc Af Amer: 58 mL/min — ABNORMAL LOW (ref >60)
GFR calc non Af Amer: 50 mL/min — ABNORMAL LOW (ref >60)
Glucose, Bld: 94 mg/dL (ref 70–99)
POTASSIUM: 5.1 mmol/L (ref 3.5–5.1)
Sodium: 131 mmol/L — ABNORMAL LOW (ref 135–145)
Total Protein: 7.2 g/dL (ref 6.5–8.1)

## 2018-10-15 LAB — CBC WITH DIFFERENTIAL/PLATELET
Abs Immature Granulocytes: 0.06 10*3/uL (ref 0.00–0.07)
Basophils Absolute: 0 10*3/uL (ref 0.0–0.1)
Basophils Relative: 0 %
Eosinophils Absolute: 0.7 10*3/uL — ABNORMAL HIGH (ref 0.0–0.5)
Eosinophils Relative: 6 %
HCT: 35.6 % — ABNORMAL LOW (ref 36.0–46.0)
Hemoglobin: 11.5 g/dL — ABNORMAL LOW (ref 12.0–15.0)
Immature Granulocytes: 1 %
Lymphocytes Relative: 8 %
Lymphs Abs: 0.8 10*3/uL (ref 0.7–4.0)
MCH: 26.9 pg (ref 26.0–34.0)
MCHC: 32.3 g/dL (ref 30.0–36.0)
MCV: 83.2 fL (ref 80.0–100.0)
Monocytes Absolute: 1 10*3/uL (ref 0.1–1.0)
Monocytes Relative: 10 %
NEUTROS ABS: 7.8 10*3/uL — AB (ref 1.7–7.7)
Neutrophils Relative %: 75 %
Platelets: 360 10*3/uL (ref 150–400)
RBC: 4.28 MIL/uL (ref 3.87–5.11)
RDW: 14.6 % (ref 11.5–15.5)
WBC: 10.4 10*3/uL (ref 4.0–10.5)
nRBC: 0 % (ref 0.0–0.2)

## 2018-10-15 LAB — GROUP A STREP BY PCR: Group A Strep by PCR: NOT DETECTED

## 2018-10-15 LAB — INFLUENZA PANEL BY PCR (TYPE A & B)
INFLAPCR: NEGATIVE
Influenza B By PCR: NEGATIVE

## 2018-10-15 LAB — LACTIC ACID, PLASMA: Lactic Acid, Venous: 0.7 mmol/L (ref 0.5–1.9)

## 2018-10-15 MED ORDER — SODIUM CHLORIDE 0.9 % IV BOLUS
1000.0000 mL | Freq: Once | INTRAVENOUS | Status: AC
  Administered 2018-10-15: 1000 mL via INTRAVENOUS

## 2018-10-15 MED ORDER — KETOROLAC TROMETHAMINE 30 MG/ML IJ SOLN
15.0000 mg | Freq: Once | INTRAMUSCULAR | Status: AC
  Administered 2018-10-15: 15 mg via INTRAVENOUS
  Filled 2018-10-15: qty 1

## 2018-10-15 NOTE — ED Notes (Signed)
Discharge papers given to pt. Pt acknowledges understanding. Esignature pad not available

## 2018-10-15 NOTE — Discharge Instructions (Signed)
Please rest and drink plenty of fluids Take Tylenol for pain and fever Return if worsening

## 2018-10-15 NOTE — ED Triage Notes (Addendum)
  Patient BIB EMS for URI symptoms.  Patient states she has had symptoms for 3 weeks.  Patient tested positive for strep around Thanksgiving and was negative for the flu.  This morning she started having diarrhea and fever with general body aches.  Patient states 101.4 temp.  Patient has been prescribed two different antibiotics and does not feel any better.  Pain 7/10.  A&O x4.

## 2018-10-15 NOTE — ED Provider Notes (Signed)
42 year old female presents with subjective fever, chills, body aches and an episode of diarrhea with nausea at home. She has had multiple illnesses over the past couple weeks with strep, uveitis, sinusitis. Labs, fluids, toradol were ordered by previous provider.   CBC is remarkable for mild anemia. CMP is remarkable for mild hyponatremia (131), mildly low bicarb (21), mild AKI (1.31). Will give another liter of fluid. Lactic acid is normal. Strep and flu are negative. Symptoms are likely viral GI illness. Rechecked pt and discussed results. Anticipate d/c after fluids and PO challenge.   Bethel BornGekas, Dewan Emond Marie, PA-C 10/15/18 [id number]    Tilden Fossaees, Elizabeth, MD 10/15/18 1003

## 2018-10-15 NOTE — ED Provider Notes (Signed)
[facility] EMERGENCY DEPARTMENT Provider Note   CSN: [id number] Arrival date & time: 10/15/18  0455     History   Chief Complaint Chief Complaint  Patient presents with  . URI  . Fever    HPI Rita Haynes is a 42 y.o. female.  Patient with history of hypertension presents with recent illness over the last 3 weeks including strep pharyngitis, left ocular uveitis with multi-antibiotic regimens as well as steroids, completing the last antibiotic today. She started feeling ill again last night with fever, generalized body aches and diarrhea x 1. She tried to go to sleep and continued to feel worse, needing assistance to get up to the bathroom secondary to muscle pain, and then becoming lightheaded while walking, prompting a call to EMS.   The history is provided by the patient. No language interpreter was used.  URI   Associated symptoms include diarrhea and headaches. Pertinent negatives include no abdominal pain, no nausea, no congestion and no cough.  Fever   Associated symptoms include diarrhea and headaches. Pertinent negatives include no congestion and no cough.    Past Medical History:  Diagnosis Date  . Hypertension     There are no active problems to display for this patient.   History reviewed. No pertinent surgical history.   OB History   No obstetric history on file.      Home Medications    Prior to Admission medications   Medication Sig Start Date End Date Taking? Authorizing Provider  prednisoLONE acetate (PRED FORTE) 1 % ophthalmic suspension Place 1 drop into the left eye every 2 (two) hours while awake.   Yes [provider]  sulfamethoxazole-trimethoprim (BACTRIM DS,SEPTRA DS) 800-160 MG tablet Take 1 tablet by mouth 2 (two) times daily.   Yes [provider]  tobramycin-dexamethasone (TOBRADEX) ophthalmic ointment Place 1 application into the left eye at bedtime.   Yes [provider]    cyclobenzaprine (FLEXERIL) 10 MG tablet Take 0.5 tablet-1 tablet TID PRN Patient not taking: Reported on 10/15/2018 05/13/16   Cheri Fowler, PA-C  HYDROcodone-acetaminophen (NORCO/VICODIN) 5-325 MG tablet Take 1 tablet by mouth every 4 (four) hours as needed. Patient not taking: Reported on 10/15/2018 05/13/16   Cheri Fowler, PA-C  naproxen (NAPROSYN) 500 MG tablet Take 1 tablet (500 mg total) by mouth 2 (two) times daily. Patient not taking: Reported on 10/15/2018 05/13/16   Cheri Fowler, PA-C    Family History History reviewed. No pertinent family history.  Social History Social History   Tobacco Use  . Smoking status: Never Smoker  Substance Use Topics  . Alcohol use: No  . Drug use: No     Allergies   Amoxicillin and Cefuroxime   Review of Systems Review of Systems  Constitutional: Positive for fever. Negative for chills.  HENT: Negative.  Negative for congestion.   Respiratory: Negative.  Negative for cough and shortness of breath.   Cardiovascular: Negative.   Gastrointestinal: Positive for diarrhea. Negative for abdominal pain and nausea.  Musculoskeletal: Positive for myalgias.  Skin: Negative.   Neurological: Positive for light-headedness and headaches. Negative for syncope.     Physical Exam Updated Vital Signs BP 130/73 (BP Location: Right Arm)   Pulse 90   Temp 99.8 F (37.7 C) (Oral)   Resp 20   Ht 5\' 8"  (1.727 m)   Wt 98.9 kg   LMP 10/08/2018   SpO2 100%   BMI 33.15 kg/m   Physical Exam Vitals signs and nursing  note reviewed.  Constitutional:      Appearance: She is well-developed.  HENT:     Head: Normocephalic.     Nose: Nose normal.     Mouth/Throat:     Mouth: Mucous membranes are dry.     Pharynx: Posterior oropharyngeal erythema present. No oropharyngeal exudate.  Eyes:     Conjunctiva/sclera: Conjunctivae normal.     Pupils: Pupils are equal, round, and reactive to light.  Neck:     Musculoskeletal: Normal range of motion and neck  supple.  Cardiovascular:     Rate and Rhythm: Normal rate and regular rhythm.     Heart sounds: No murmur.  Pulmonary:     Effort: Pulmonary effort is normal.     Breath sounds: Normal breath sounds. No wheezing, rhonchi or rales.  Chest:     Chest wall: No tenderness.  Abdominal:     General: Bowel sounds are normal.     Palpations: Abdomen is soft.     Tenderness: There is no abdominal tenderness. There is no guarding or rebound.  Musculoskeletal: Normal range of motion.  Skin:    General: Skin is warm and dry.     Findings: No rash.  Neurological:     Mental Status: She is alert and oriented to person, place, and time.  Psychiatric:        Mood and Affect: Mood normal.      ED Treatments / Results  Labs (all labs ordered are listed, but only abnormal results are displayed) Labs Reviewed  CBC WITH DIFFERENTIAL/PLATELET  COMPREHENSIVE METABOLIC PANEL  LACTIC ACID, PLASMA  LACTIC ACID, PLASMA  URINALYSIS, ROUTINE W REFLEX MICROSCOPIC    EKG None  Radiology No results found.  Procedures Procedures (including critical care time)  Medications Ordered in ED Medications  sodium chloride 0.9 % bolus 1,000 mL (has no administration in time range)  ketorolac (TORADOL) 30 MG/ML injection 15 mg (has no administration in time range)     Initial Impression / Assessment and Plan / ED Course  I have reviewed the triage vital signs and the nursing notes.  Pertinent labs & imaging results that were available during my care of the patient were reviewed by me and considered in my medical decision making (see chart for details).     Patient presents with severe muscle aches, fever, diarrhea and frontal headache that started last night. She reports being sick on and off since just before Thanksgiving with strep throat and an eye infection. She has been on multiple antibiotics and a steroid.   The patient is nontoxic in appearance. She appears dry with peeling lips. VSS.  IVF's, labs ordered. IV Toradol for myalgia. C. Diff.   Patient care signed out to Terance HartKelly Gekas, PA-C, pending labs, IV fluids, re-evaluation. Anticipate discharge home.   Final Clinical Impressions(s) / ED Diagnoses   Final diagnoses:  None   1. Febrile illness  ED Discharge Orders    None       Elpidio AnisUpstill, Demarcus Thielke, PA-C 10/15/18 [id number]    Ward, Layla MawKristen N, DO 10/15/18 [phone]

## 2018-11-19 ENCOUNTER — Other Ambulatory Visit: Payer: Self-pay | Admitting: Neurosurgery

## 2018-11-19 DIAGNOSIS — M5416 Radiculopathy, lumbar region: Secondary | ICD-10-CM

## 2018-11-21 ENCOUNTER — Ambulatory Visit
Admission: RE | Admit: 2018-11-21 | Discharge: 2018-11-21 | Disposition: A | Discharge status: Home / Self Care | Payer: BC Managed Care – PPO | Source: Ambulatory Visit | Attending: Neurosurgery | Admitting: Neurosurgery

## 2018-11-21 DIAGNOSIS — M5416 Radiculopathy, lumbar region: Secondary | ICD-10-CM

## 2018-11-21 MED ORDER — GADOBENATE DIMEGLUMINE 529 MG/ML IV SOLN
20.0000 mL | Freq: Once | INTRAVENOUS | Status: AC | PRN
  Administered 2018-11-21: 20 mL via INTRAVENOUS

## 2020-12-15 IMAGING — MR MR LUMBAR SPINE WO/W CM
4 of 7 series · 15 of 48 positions shown · IV contrast (multihance)
Comparison: Comparison made with prior radiographs from 11/17/2018
as well as previous MRI from 05/13/2016.

CLINICAL DATA: Initial evaluation for recurrent low back pain
radiating into the right lower extremity to toes with associated
numbness, tingling, and weakness. History of prior L4-5
decompression and micro dissect for ruptured disc approximately 3
years ago.

EXAM:
MRI LUMBAR SPINE WITHOUT AND WITH CONTRAST
TECHNIQUE: Multiplanar and multiecho pulse sequences of the lumbar spine were
obtained without and with intravenous contrast.
CONTRAST:  20mL MULTIHANCE GADOBENATE DIMEGLUMINE 529 MG/ML IV SOLN

[Series 8: T1 · sagittal · 4.0mm · 0.73mm/px · 3 of 15 slices shown (1 of 2)]
[im 1/15]
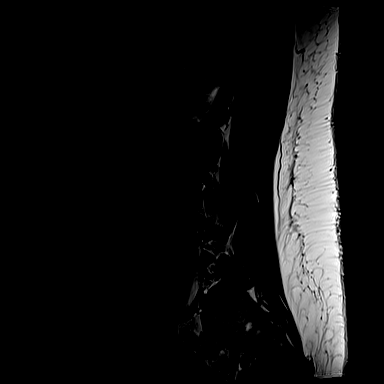
[im 10/15]
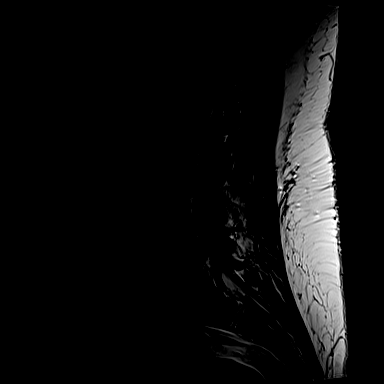
[im 15/15]
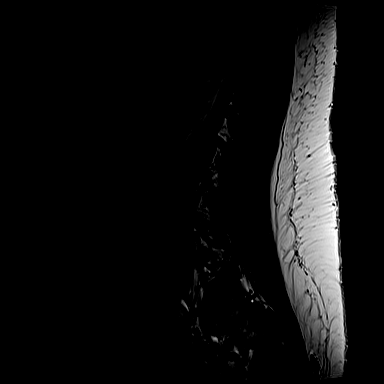

[Series 11: T1 · axial · 4.0mm · 0.28mm/px · z∈[-96,+79]mm · 3 of 40 slices shown (2 of 2)]
[im 4/40]
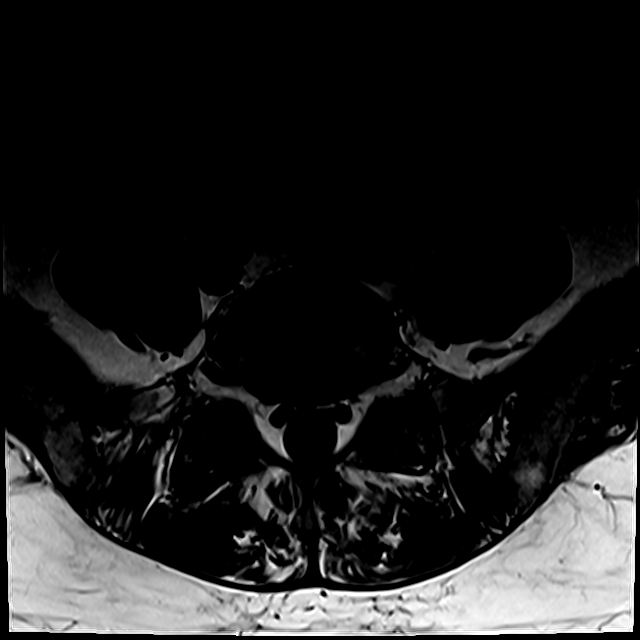
[im 20/40]
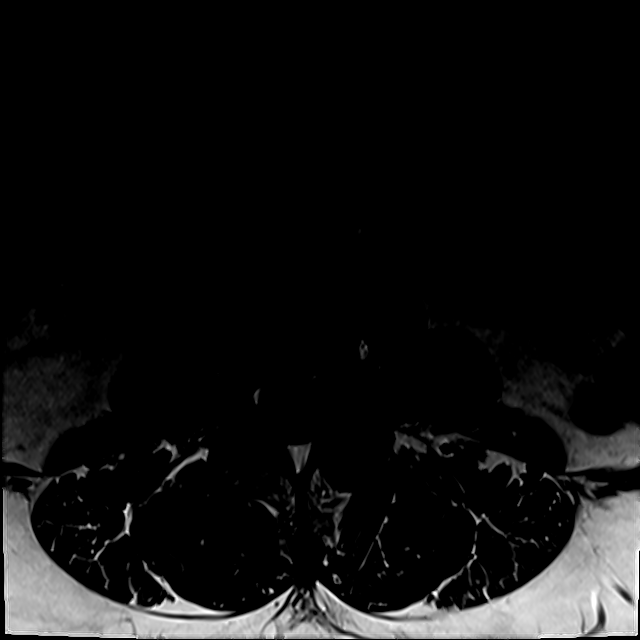
[im 36/40]
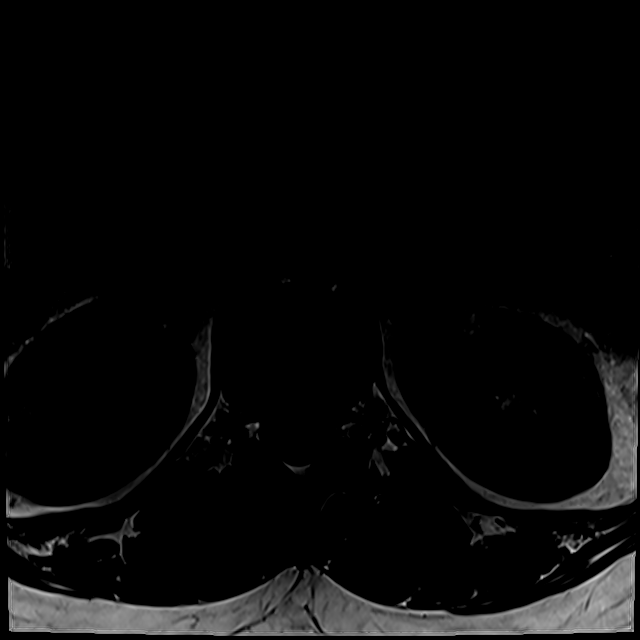

[Series 14: T2 · axial · 4.0mm · 0.28mm/px · z∈[-111,+79]mm · 6 of 40 slices shown (1 of 2)]
[im 1/40]
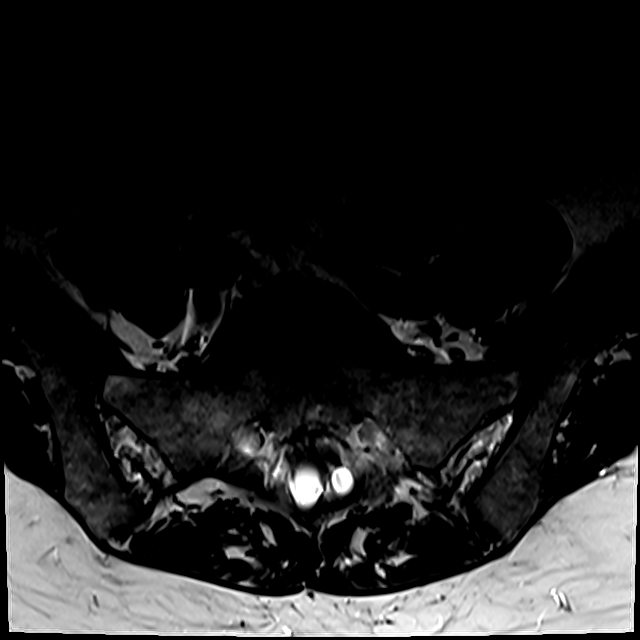
[im 4/40]
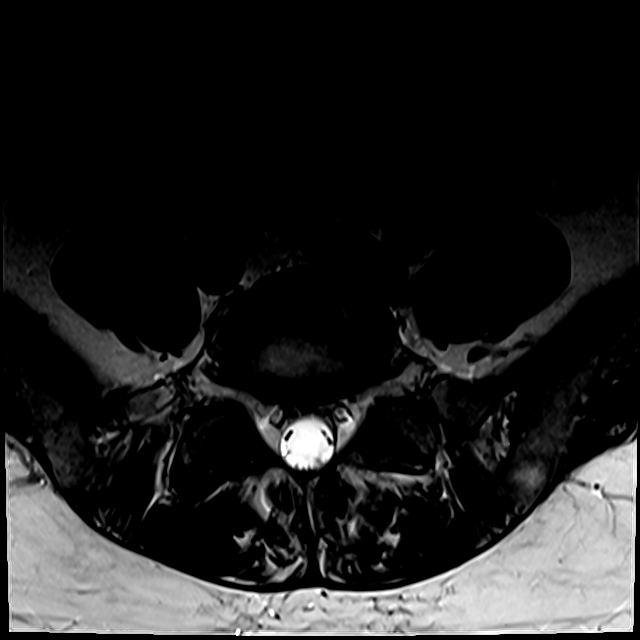
[im 8/40]
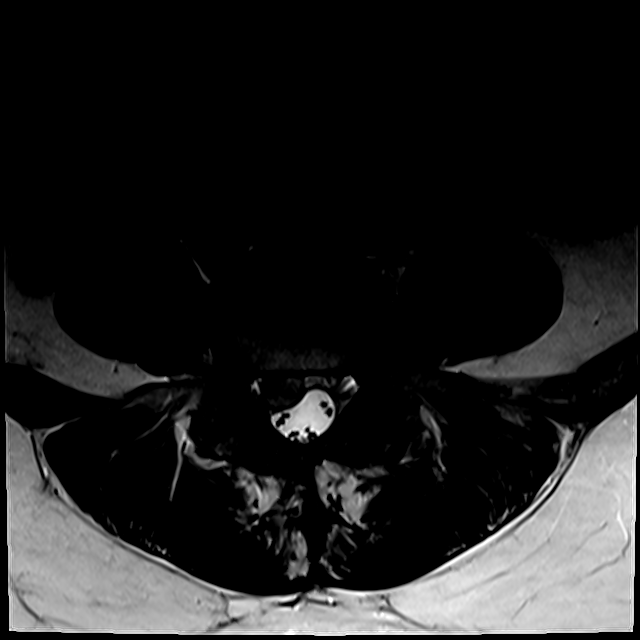
[im 12/40]
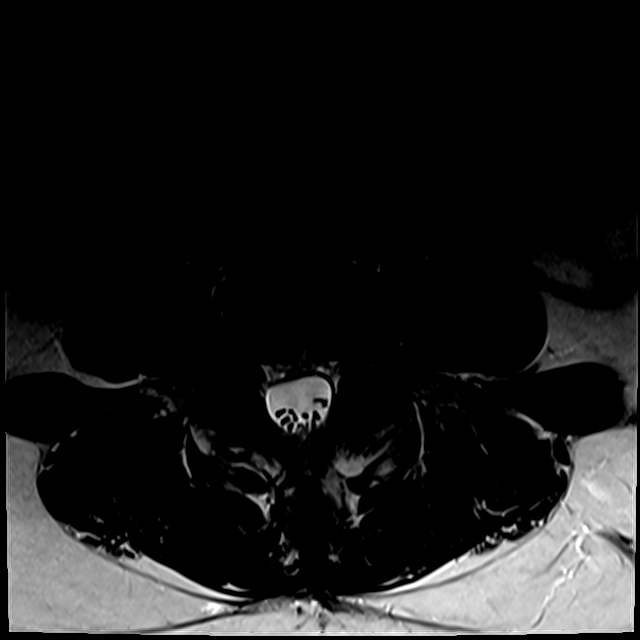
[im 20/40]
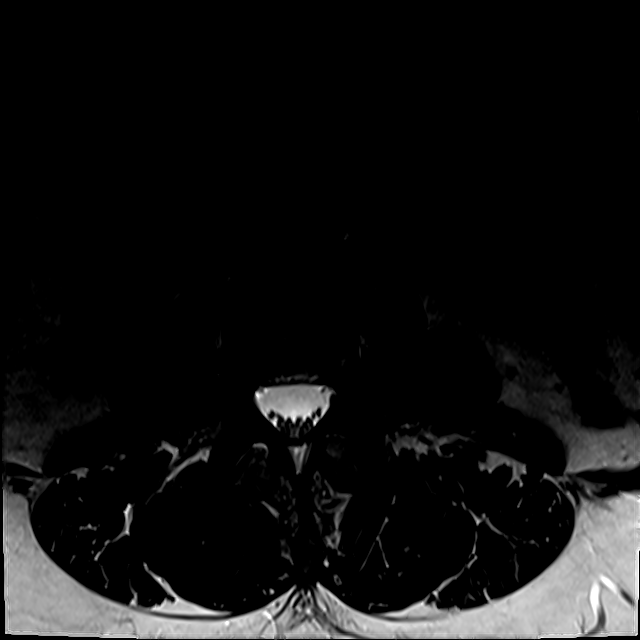
[im 36/40]
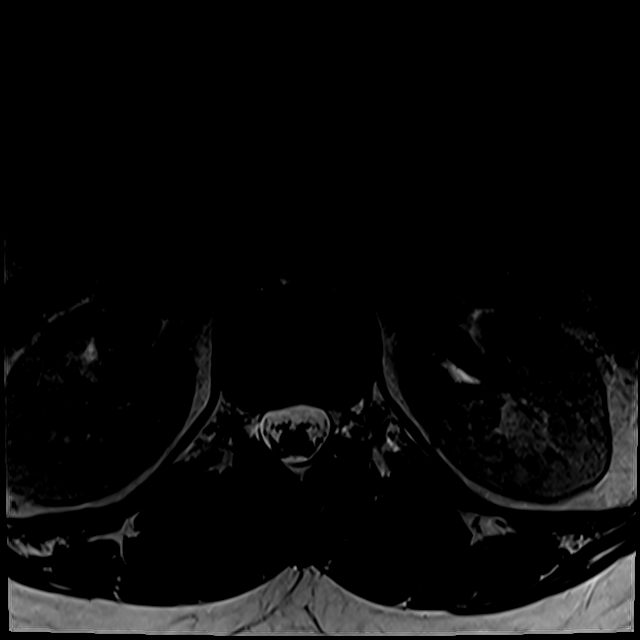

[Series 15: T2 · sagittal · 4.0mm · 0.73mm/px · 3 of 15 slices shown (2 of 2)]
[im 1/15]
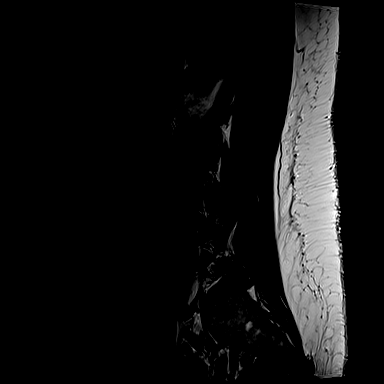
[im 10/15]
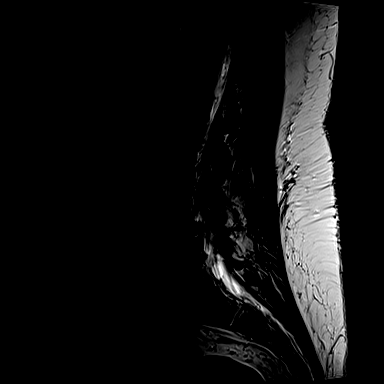
[im 15/15]
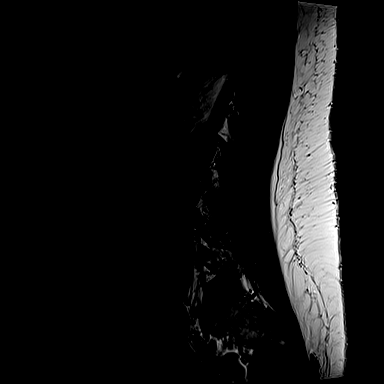

[15 of 48 positions shown; findings below may reference images not displayed]

FINDINGS: Segmentation: Standard. Lowest well-formed disc labeled the L5-S1
level. Same numbering system is employed as on previous exam.

Alignment: Stable lumbar alignment with preservation of the normal
lumbar lordosis. No listhesis or subluxation.

Vertebrae: Vertebral body heights maintained without evidence for
acute or chronic fracture. Underlying bone marrow signal intensity
within normal limits. Subcentimeter benign hemangioma noted within
the L1 vertebral body. No other discrete or worrisome osseous
lesions. Chronic discogenic reactive endplate changes present about
the L4-5 interspace, similar to previous. Intervertebral disc space
narrowing at this level has progressed from prior. No other abnormal
marrow edema or enhancement.

Conus medullaris and cauda equina: Conus extends to the L1 level.
Conus and cauda equina appear normal.

Paraspinal and other soft tissues: Paraspinous soft tissues within
normal limits. Visualized visceral structures unremarkable.

Disc levels:

L1-2:  Unremarkable.

L2-3:  Unremarkable.

L3-4: Negative interspace. Mild bilateral facet hypertrophy. No
canal or foraminal stenosis.

L4-5: Chronic intervertebral disc space narrowing, progressed from
previous. Disc desiccation with mild diffuse disc bulge and reactive
endplate changes. Postoperative changes from previous right hemi
laminectomy and micro discectomy. Postoperative enhancing
granulation tissue within the right lateral epidural space and right
lateral recess. Residual and/or recurrent disc extrusion with
inferior migration present within the right lateral recess,
measuring 6 x 10 x 23 mm (AP by transverse by craniocaudad).
Inferior migration extends approximately 16 mm inferior to the
parent L4-5 interspace. Surrounding peridiscal enhancement.
Secondary impingement on the descending right L5 nerve root as it
courses through the right lateral recess. Superimposed moderate
bilateral facet hypertrophy, worse on the right. Mild effacement of
the right ventral thecal sac without significant spinal stenosis.
Moderate bilateral L4 foraminal narrowing mildly progressed from
previous.

L5-S1: Negative interspace. Moderate bilateral facet hypertrophy,
worse on the right, and progressed from previous. No significant
stenosis or impingement.
IMPRESSION: 1. Postoperative changes from prior right hemi laminectomy with
micro discectomy at L4-5, with moderate sized residual and/or
recurrent right subarticular disc extrusion with inferior migration
as above. Secondary severe right lateral recess stenosis with right
L5 nerve root impingement.
2. Moderate bilateral L4 foraminal stenosis related to disc bulge
and facet hypertrophy, mildly progressed from previous.
3. Progressive moderate bilateral facet hypertrophy at L5-S1 without
stenosis.

## 2022-04-23 ENCOUNTER — Ambulatory Visit (HOSPITAL_BASED_OUTPATIENT_CLINIC_OR_DEPARTMENT_OTHER): Admit: 2022-04-23 | Payer: BC Managed Care – PPO | Admitting: Obstetrics

## 2022-04-23 ENCOUNTER — Encounter (HOSPITAL_BASED_OUTPATIENT_CLINIC_OR_DEPARTMENT_OTHER): Payer: Self-pay

## 2022-04-23 DIAGNOSIS — N920 Excessive and frequent menstruation with regular cycle: Secondary | ICD-10-CM

## 2022-04-23 SURGERY — SALPINGECTOMY, BILATERAL, LAPAROSCOPIC
Anesthesia: General

## 2023-11-04 ENCOUNTER — Telehealth: Payer: Self-pay | Admitting: Genetic Counselor

## 2023-11-04 NOTE — Telephone Encounter (Signed)
Patient scheduled per scheduling message. Patient is aware of made appointments and will be mailed an appointment reminder.

## 2024-01-05 ENCOUNTER — Encounter: Payer: Self-pay | Admitting: Genetic Counselor

## 2024-01-05 ENCOUNTER — Inpatient Hospital Stay: Payer: Self-pay | Attending: Genetic Counselor | Admitting: Genetic Counselor

## 2024-01-05 ENCOUNTER — Inpatient Hospital Stay: Payer: Self-pay

## 2024-01-05 DIAGNOSIS — Z8042 Family history of malignant neoplasm of prostate: Secondary | ICD-10-CM | POA: Diagnosis not present

## 2024-01-05 DIAGNOSIS — Z800 Family history of malignant neoplasm of digestive organs: Secondary | ICD-10-CM

## 2024-01-05 DIAGNOSIS — Z803 Family history of malignant neoplasm of breast: Secondary | ICD-10-CM

## 2024-01-05 DIAGNOSIS — Z8481 Family history of carrier of genetic disease: Secondary | ICD-10-CM

## 2024-01-05 LAB — GENETIC SCREENING ORDER

## 2024-01-05 NOTE — Progress Notes (Unsigned)
 REFERRING PROVIDER: Renaye Rakers, MD [address], [address] [place],  [place] [address]  PRIMARY PROVIDER:  Renaye Rakers, MD  PRIMARY REASON FOR VISIT:  No diagnosis found.   HISTORY OF PRESENT ILLNESS:   [name], a 48 y.o. female, was seen for a [facility] cancer genetics consultation at the request of Dr.*** Parke Simmers {Provider title:[id number]}*** due to a {Personal/family:20331} history of {cancer/polyps}.  Ms. Haynes presents to clinic today to discuss the possibility of a hereditary predisposition to cancer, to discuss genetic testing, and to further clarify her future cancer risks, as well as potential cancer risks for family members.   Ms. Staff is a 47 y.o. female with no personal history of cancer.    CANCER HISTORY:  Oncology History   No history exists.     SCREENING/RISK FACTORS:  Mammogram within the last year: yes Number of breast biopsies: 0. Colonoscopy: yes; this year;  no polyps per patient . Hysterectomy: no.  Ovaries intact: yes.  Menarche was at age 40.  First live birth at age 31.  Menopausal status: premenopausal.  OCP use for approximately 12 years.  HRT use: 0 years. Dermatology screening: no   Past Medical History:  Diagnosis Date   Hypertension     No past surgical history on file.   FAMILY HISTORY:  We obtained a detailed, 4-generation family history.  Significant diagnoses are listed below: No family history on file.  [name] is {aware/unaware} of previous family history of genetic testing for hereditary cancer risks. Patient's maternal ancestors are of *** descent, and paternal ancestors are of *** descent. There {IS NO:12509} reported Ashkenazi Jewish ancestry. There {IS NO:12509} known consanguinity.  GENETIC COUNSELING ASSESSMENT: Rita Haynes is a 48 y.o. female with a {Personal/family:20331} history of {cancer/polyps} which is somewhat suggestive of a {DISEASE} and predisposition to cancer given ***. We, therefore, discussed and recommended  the following at today's visit.   DISCUSSION: We discussed that *** - ***% of *** is hereditary, with most cases of hereditary *** cancer associated with ***.  There are other genes that can be associated with hereditary *** cancer syndromes.  These include ***.  We discussed that testing is beneficial for several reasons, including knowing about other cancer risks, identifying potential screening and risk-reduction options that may be appropriate, and understanding if other family members could be at an increased risk for cancer and allowing them to undergo genetic testing.  We reviewed the characteristics, features and inheritance patterns of hereditary cancer syndromes. We also discussed genetic testing, including the appropriate family members to test, the process of testing, insurance coverage and turn-around-time for results. We discussed the implications of a negative, positive, and variant of uncertain significant result. ***We discussed that negative results would be uninformative given that [name] does not have a personal history of cancer. We recommended Rita Haynes pursue genetic testing for a panel that contains genes associated with ***.  Rita Haynes was offered a common hereditary cancer panel (~40 genes) and an expanded pan-cancer panel (~70 genes). Rita Haynes was informed of the benefits and limitations of each panel, including that expanded pan-cancer panels contain several genes that do not have clear management guidelines at this point in time.  We also discussed that as the number of genes included on a panel increases, the chances of variants of uncertain significance increases.  After considering the benefits and limitations of each gene panel, Rita Haynes elected to have an *** through ***.   Based on Rita Haynes's {  Personal/family:20331} history of cancer, she meets medical criteria for genetic testing. Despite that she meets criteria, she may still have an out of pocket cost. We  discussed that if her out of pocket cost for testing is over $100, the laboratory should contact them to discuss self-pay options and/or patient pay assistance programs.   ***We reviewed the characteristics, features and inheritance patterns of hereditary cancer syndromes. We also discussed genetic testing, including the appropriate family members to test, the process of testing, insurance coverage and turn-around-time for results. We discussed the implications of a negative, positive and/or variant of uncertain significant result. In order to get genetic test results in a timely manner so that Rita Haynes can use these genetic test results for surgical decisions, we recommended [name] pursue genetic testing for the ***. Once complete, we recommend Rita Haynes pursue reflex genetic testing to the *** gene panel.   Based on Rita Haynes's {Personal/family:20331} history of cancer, she meets medical criteria for genetic testing. Despite that she meets criteria, she may still have an out of pocket cost.   ***We discussed with Rita Haynes that the {Personal/family:20331} history does not meet insurance or NCCN criteria for genetic testing and, therefore, is not highly consistent with a familial hereditary cancer syndrome.  We feel she is at low risk to harbor a gene mutation associated with such a condition. Thus, we did not recommend any genetic testing, at this time, and recommended Rita Haynes continue to follow the cancer screening guidelines given by her primary healthcare provider.  ***In order to estimate her chance of having a {CA GENE:62345} mutation, we used statistical models ({GENMODELS:62370}) that consider her personal medical history, family history and ancestry.  Because each model is different, there can be a lot of variability in the risks they give.  Therefore, these numbers must be considered a rough range and not a precise risk of having a {CA GENE:62345} mutation.  These models estimate that  she has approximately a ***-***% chance of having a mutation. Based on this assessment of her family and personal history, genetic testing {IS/ISNOT:34056} recommended.  ***Based on the patient's {Personal/family:20331} history, a statistical model ({GENMODELS:62370}) was used to estimate her risk of developing {CA HX:54794}. This estimates her lifetime risk of developing {CA HX:54794} to be approximately ***%. This estimation does not consider any genetic testing results.  The patient's lifetime breast cancer risk is a preliminary estimate based on available information using one of several models endorsed by the American Cancer Society (ACS). The ACS recommends consideration of breast MRI screening as an adjunct to mammography for patients at high risk (defined as 20% or greater lifetime risk).   ***Ms. Dicostanzo has been determined to be at high risk for breast cancer.  Therefore, we recommend that annual screening with mammography and breast MRI be performed.  ***begin at age 48, or 10 years prior to the age of breast cancer diagnosis in a relative (whichever is earlier).  We discussed that Ms. Mcglaun should discuss her individual situation with her referring physician and determine a breast cancer screening plan with which they are both comfortable.    We discussed the Genetic Information Non-Discrimination Act (GINA) of 2008, which helps protect individuals against genetic discrimination based on their genetic test results.  It impacts both health insurance and employment.  With health insurance, it protects against genetic test results being used for increased premiums or policy termination. For employment, it protects against hiring, firing and promoting decisions based on genetic test  results.  GINA does not apply to those in the [employer], those who work for companies with less than 15 employees, and new life insurance or long-term disability insurance policies.  Health status due to a cancer diagnosis  is not protected under GINA.  PLAN: After considering the risks, benefits, and limitations, Ms. Depierro provided informed consent to pursue genetic testing and the blood sample was sent to {Lab} Laboratories for analysis of the {test}. Results should be available within approximately {TAT TIME} weeks' time, at which point they will be disclosed by telephone to Ms. Buttery, as will any additional recommendations warranted by these results. Ms. Gudino will receive a summary of her genetic counseling visit and a copy of her results once available. This information will also be available in Epic.   *** Despite our recommendation, Ms. Iribe did not wish to pursue genetic testing at today's visit. We understand this decision and remain available to coordinate genetic testing at any time in the future. We, therefore, recommend Ms. Rankin continue to follow the cancer screening guidelines given by her primary healthcare provider.  ***Based on Ms. Quraishi's family history, we recommended her ***, who was diagnosed with *** at age ***, have genetic counseling and testing. Ms. Salmons will let us know if we can be of any assistance in coordinating genetic counseling and/or testing for appropriate family members.   Lastly, we encouraged Ms. Kinsella to remain in contact with cancer genetics annually so that we can continuously update the family history and inform her of any changes in cancer genetics and testing that may be of benefit for this family.   Ms. [place] questions were answered to her satisfaction today. Our contact information was provided should additional questions or concerns arise. ***Thank you for the referral and allowing [place] to share in the care of your patient.   Kenyon Eichelberger M. Rennie Plowman, MS, [facility] Genetic Counselor [email or web address] (P) [phone]    *** minutes were spent on the date of the encounter in service to the patient including preparation, face-to-face consultation, documentation  and care coordination.  ***The patient was accompanied by ***.  ***The patient was seen alone.  Drs. Gunnar Bulla and/or Mosetta Putt were available to discuss this case as needed.   _______________________________________________________________________ For Office Staff:  Number of people involved in session: *** Was an Intern/ student involved with case: {YES/NO:63}

## 2024-01-06 ENCOUNTER — Encounter: Payer: Self-pay | Admitting: Genetic Counselor

## 2024-01-29 ENCOUNTER — Ambulatory Visit: Payer: Self-pay | Admitting: Genetic Counselor

## 2024-01-29 ENCOUNTER — Encounter: Payer: Self-pay | Admitting: Genetic Counselor

## 2024-01-29 ENCOUNTER — Telehealth: Payer: Self-pay | Admitting: Genetic Counselor

## 2024-01-29 DIAGNOSIS — Z8481 Family history of carrier of genetic disease: Secondary | ICD-10-CM

## 2024-01-29 DIAGNOSIS — Z8042 Family history of malignant neoplasm of prostate: Secondary | ICD-10-CM

## 2024-01-29 DIAGNOSIS — Z803 Family history of malignant neoplasm of breast: Secondary | ICD-10-CM

## 2024-01-29 DIAGNOSIS — Z1501 Genetic susceptibility to malignant neoplasm of breast: Secondary | ICD-10-CM

## 2024-01-29 DIAGNOSIS — Z1379 Encounter for other screening for genetic and chromosomal anomalies: Secondary | ICD-10-CM | POA: Insufficient documentation

## 2024-01-29 DIAGNOSIS — Z800 Family history of malignant neoplasm of digestive organs: Secondary | ICD-10-CM

## 2024-01-29 HISTORY — DX: Genetic susceptibility to malignant neoplasm of breast: Z15.01

## 2024-01-29 NOTE — Telephone Encounter (Signed)
 Disclosed BRCA2 positive result.  Reviewed cancer risks/management/family implication.  Detailed clinic note to folllow.  Patient agreed to referral to high risk breast clinic

## 2024-01-29 NOTE — Telephone Encounter (Signed)
 Contacted patient in attempt to disclose results of genetic testing.  LVM with contact information requesting a call back.

## 2024-01-30 ENCOUNTER — Encounter: Payer: Self-pay | Admitting: Genetic Counselor

## 2024-01-30 NOTE — Progress Notes (Addendum)
 GENETIC TEST RESULTS  Patient Name: Rita Haynes Patient Age: 48 y.o. Encounter Date: 01/29/2024  Referring Provider: Jonathon Neighbors, MD   Rita Haynes was seen in the [facility] clinic on January 05, 2024 due to a family history of cancer and a known BRCA2 gene mutation in the family. Please refer to the prior Genetics clinic note for more information regarding Rita Haynes's medical and family histories and our assessment at the time.   FAMILY HISTORY:  We obtained a detailed, 4-generation family history.  Significant diagnoses are listed below: Family History  Problem Relation Age of Onset   Prostate cancer Father 7   BRCA 1/2 Sister        BRCA2 positive   Breast cancer Maternal Aunt 19   Breast cancer Maternal Aunt 52   Prostate cancer Paternal Uncle 60       mets   Lung cancer Paternal Uncle    Pancreatic cancer Maternal Grandmother 25   Multiple myeloma Maternal Grandfather 108   Prostate cancer Paternal Grandfather 40       mets    Rita Haynes's sister had genetic testing through the [facility] Multi-Cancer Panel in May 2023, which showed a single pathogenic variant in BRCA2 called [phone] ([id number]).  The Multi-Cancer offered by [facility] includes sequencing and/or deletion/duplication analysis of the following 84 genes:  AIP, ALK, APC, ATM, AXIN2, BAP1, BARD1, BLM, BMPR1A, BRCA1, BRCA2, BRIP1, CASR, CDC73, CDH1, CDK4, CDKN1B, CDKN1C, CDKN2A, CEBPA, CHEK2, CTNNA1, DICER1, DIS3L2, EGFR, EPCAM, FH, FLCN, GATA2, GPC3, GREM1, HOXB13, HRAS, KIT, MAX, MEN1, MET, MITF, MLH1, MSH2, MSH3, MSH6, MUTYH, NBN, NF1, NF2, NTHL1, PALB2, PDGFRA, PHOX2B, PMS2, POLD1, POLE, POT1, PRKAR1A, PTCH1, PTEN, RAD50, RAD51C, RAD51D, RB1, RECQL4, RET, RUNX1, SDHA, SDHAF2, SDHB, SDHC, SDHD, SMAD4, SMARCA4, SMARCB1, SMARCE1, STK11, SUFU, TERC, TERT, TMEM127, Tp53, TSC1, TSC2, VHL, WRN, and WT1.     Rita Haynes reported negative genetic testing in her sister and maternal cousin.  Her mother thinks  she had negative genetic testing ~2014. No reports were available for review today.     There is no reported Ashkenazi Jewish ancestry. There is no known consanguinity.  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 Specifically, this variant is [id number] [phone]).  No other deleterious variants were detected in the [facility] CancerNext-Expanded +RNAinsight Panel.  The CancerNext-Expanded gene panel offered by [facility] and includes sequencing, rearrangement, and RNA analysis for the following 76 genes: AIP, ALK, APC, ATM, AXIN2, BAP1, BARD1, BMPR1A, BRCA1, BRCA2, BRIP1, CDC73, CDH1, CDK4, CDKN1B, CDKN2A, CEBPA, CHEK2, CTNNA1, DDX41, DICER1, ETV6, FH, FLCN, GATA2, LZTR1, MAX, MBD4, MEN1, MET, MLH1, MSH2, MSH3, MSH6, MUTYH, NF1, NF2, NTHL1, PALB2, PHOX2B, PMS2, POT1, PRKAR1A, PTCH1, PTEN, RAD51C, RAD51D, RB1, RET, RUNX1, SDHA, SDHAF2, SDHB, SDHC, SDHD, SMAD4, SMARCA4, SMARCB1, SMARCE1, STK11, SUFU, TMEM127, TP53, TSC1, TSC2, VHL, and WT1 (sequencing and deletion/duplication); EGFR, HOXB13, KIT, MITF, PDGFRA, POLD1, and POLE (sequencing only); EPCAM and GREM1 (deletion/duplication only).    The test report has been scanned into EPIC and is located under the Molecular Pathology section of the Results Review tab.  A portion of the result report is included below for reference. Genetic testing reported out on January 27, 2024.     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  At this time, it is unknown if this variant is associated with increased cancer risk or if this is a normal finding, but most variants  such as this get reclassified to being inconsequential. It should not be used to make medical management decisions. With time, we suspect the lab will determine the significance of this variant, if any. If we do learn more about it, we will try to contact Rita Haynes to  discuss it further. However, it is important to stay in touch with us  periodically and keep the address and phone number up to date.  Update: VUS in BRCA2 at p.H150R (c.449A>G) has been downgraded to likely benign. Classification of pathogenic variant in BRCA2 at [id number] has not changed. Amended report date is 03/02/2024.     Clinical Information: Hereditary breast and ovarian cancer (HBOC) syndrome is characterized by an increased lifetime risk for, generally, adult-onset cancers including, breast, contralateral breast, female breast, ovarian, prostate, melanoma and pancreatic.  The cancers associated with BRCA2 are: Female breast cancer: 55-69% risk In women with a history of breast cancer, the risk for contralateral breast cancer 20 years after breast cancer diagnosis is ~25%.  Specific risk numbers are dependent on various factors, including age at diagnosis.  Female breast cancer, up to an 7% risk by age 7 Ovarian cancer, 13-29% risk Pancreatic cancer, 5-10% risk Prostate cancer, 19-61% risk Melanoma, elevated risk   Management Recommendations:  Breast Screening/Risk Reduction:  Females: Breast awareness starting at age 80 Clinical breast examination every 6-12 months starting at age 20  Breast cancer screening: Age 56-29 years, annual breast MRI with and without contrast (or mammogram, if MRI is unavailable), although the age to initiate screening may be individualized based on family history if breast cancer before age 55 is present Age 63-75 years, annual mammogram and breast MRI with and without contrast Age >75 years, management should be considered on an individual basis For women with a BRCA2 pathogenic or likely pathogenic variant who are treated for breast cancer and have not had a bilateral mastectomy, screening with annual mammogram and breast MRI should continue as described above. The option of prophylactic bilateral risk-reducing mastectomy (RRM), removal of the  breast tissue before cancer develops, is the best option for significantly decreasing the risk of developing breast cancer. Studies have shown mastectomies reduce the risk of breast cancer by 90-95% in women with a BRCA2 mutation.   Males: Breast self-exam training and education starting at age 48 years Annual clinical breast exam starting at age 91 years  Consider annual mammogram starting at age 6 or 10 years before the earliest known female breast cancer in the family (whichever comes first).   Gynecological Cancer Screening/Risk Reduction: It is recommended that women with a BRCA2 mutation have a risk-reducing salpingo oophorectomy (RRSO), removal of the ovaries and fallopian tubes. It is reasonable to delay RRSO until age 93-45 years unless age at diagnosis in the family warrants earlier age for consideration of RRSO.  Having a RRSO is estimated to reduce the risk of ovarian cancer by up to 96%. There is still a small risk of developing an "ovarian-like" cancer in the lining of the abdomen, called the peritoneum. Another benefit to having the ovaries removed is the risk reduction for breast cancer. If the ovaries are removed before menopause, the risk of developing breast cancer is reduced. Consideration of combination estrogen/progestin contraception (such as oral contraceptive pills) for ovulation suppression. Overall, studies in pathogenic/likely pathogenic variant carriers support significant risk reduction benefits for ovarian cancer.  Levonorgestrel intrauterine device (LNG-IUD) has been shown to reduce risk for ovarian cancer in the average risk population. Ovarian cancer screening is  an option for women who chose not to have a RRSO or who have not yet completed their family. Current screening methods for ovarian cancer are neither sensitive nor specific, meaning that often early stage ovarian cancer cannot be diagnosed through this screening.  Screening can also be falsely positive with no  cancer present. For this reason, RRSO is recommended over screening. If ovarian cancer screening is recommended by a physician, it could include: CA-125 blood tests Transvaginal ultrasounds Clinical pelvic exams   Skin Cancer Screening and Risk Reduction: Regular skin self-examinations Individuals should notify their physicians of any changes to moles such as increasing in size, darkening in color, or other change in appearance. Annual skin examinations by a dermatologist  Follow sun-safety recommendations such as: Using UVA and UVB 30 SPF or higher sunscreen Avoiding sunburns Limiting sun exposure, especially during the hours of 11am-4pm  Wearing protective clothing and sunglasses Avoid using tanning beds For more information about the prevention of melanoma visit [email or web address]   Prostate Cancer Screening: Annual digital rectal exam (DRE) at age 32 Annual PSA blood test at age 46  Pancreatic Cancer Screening/Risk Reduction: Avoid smoking, heavy alcohol use, and obesity. Consider pancreatic cancer screening beginning at age 58 years (or 10 years before the youngest diagnosis of pancreatic cancer in the family, whichever is earlier).  Consider screening using annual contrast-enhanced MRI/magnetic resonance cholangiopancreatography (MRCP) and/or endoscopic ultrasound (EUS), with consideration of shorter screening intervals, based on clinical judgement, for individuals found to have potentially concerning abnormalities on screening.   Additional Considerations: Individuals at risk for developing breast and ovarian cancer may benefit from the use of medication to reduce their risk for cancer. These medications are referred to as chemoprevention. For example, oral contraceptive use has been shown to reduce the risk of ovarian cancer by approximately 60% in BRCA2 mutation carriers if taken for at least 5 years. This risk reduction remains even after discontinuation of oral  contraceptives. Studies have suggested PARP inhibitors may be a beneficial chemotherapeutic agent for a subset of patients with BRCA2-associated breast, ovarian, prostate, and pancreatic cancers.  Patients of reproductive age should be made aware of options for prenatal diagnosis and assisted reproduction including pre-implantation genetic diagnosis. Individuals with a single pathogenic BRCA2 variant are carriers of Fanconi anemia. Fanconi anemia is characterized by developmental delay apparent from infancy, short stature, microcephaly, and coarse dysmorphic features. For there to be a risk of Fanconi anemia in offspring, both the patient and their partner would each have to carry a pathogenic variant in BRCA2. In this case, the risk of having an affected child is 25%.    This information is based on current understanding of the gene and may change in the future.   Implications for Family Members: Hereditary predisposition to cancer due to pathogenic variants in the BRCA2 gene has autosomal dominant inheritance. This means that an individual with a pathogenic variant has a 50% chance of passing the condition on to his/her offspring. Identification of a pathogenic variant allows for the recognition of at-risk relatives who can pursue testing for the familial variant.  Family members are recommended to consider genetic testing for this familial pathogenic variant. As there are generally no childhood cancer risks associated with a single pathogenic variant in the BRCA2 gene, individuals in the family are not recommended to have testing until they reach at least 48 years of age. They may contact our office at [phone] for more information or to schedule an appointment. Complimentary testing for the familial variant  through [facility] is available for 90 days after the genetic testing report date (January 27, 2024).  Family members who live outside of the area are encouraged to find a genetic counselor  in their area by visiting: [email or web address].  Resources: FORCE (Facing Our Risk of Cancer Empowered) is a resource for those with a hereditary predisposition to develop cancer.  FORCE provides information about risk reduction, advocacy, legislation, and clinical trials.  Additionally, FORCE provides a platform for collaboration and support; which includes: peer navigation, message boards, local support groups, a toll-free helpline, research registry and recruitment, advocate training, published medical research, webinars, brochures, mastectomy photos, and more.  For more information, visit www.facingourrisk.org  Plan:  Referral to high risk breast clinic to discuss screening and risk reducing options.  They can also refer to GYN ONC at that time for discussion of RRSO.  Consider pancreatic cancer screening/referral to  GI for discussion at age 58.  Note routed to PCP Dr. Nida Barrow.  Discussed that patient should discuss dermatology referral with her PCP.  Family letter provided to encourage family testing.  Rita Haynes said she will send copy of her mother's report to ensure her testing in 2014 included the family variant.    We recommend Rita Haynes keep in touch with genetics to ensure she receives BRCA2-related and genetic testing-related updates.  Questions were answered, and our contact number was provided in case questions arise.    Jennessa Trigo M. Ora Billing, MS, [facility] Genetic Counselor [email or web address] (P) [phone]

## 2024-02-25 ENCOUNTER — Telehealth: Payer: Self-pay

## 2024-02-25 NOTE — Telephone Encounter (Signed)
 Left message on voicemail about appointment on 4/24

## 2024-02-26 ENCOUNTER — Inpatient Hospital Stay

## 2024-02-26 ENCOUNTER — Inpatient Hospital Stay: Attending: Genetic Counselor | Admitting: Hematology and Oncology

## 2024-02-26 ENCOUNTER — Telehealth: Payer: Self-pay

## 2024-02-26 VITALS — BP 125/71 | HR 89 | Temp 98.1°F | Resp 16 | Wt 227.5 lb

## 2024-02-26 DIAGNOSIS — Z803 Family history of malignant neoplasm of breast: Secondary | ICD-10-CM | POA: Diagnosis not present

## 2024-02-26 DIAGNOSIS — Z808 Family history of malignant neoplasm of other organs or systems: Secondary | ICD-10-CM | POA: Insufficient documentation

## 2024-02-26 DIAGNOSIS — Z800 Family history of malignant neoplasm of digestive organs: Secondary | ICD-10-CM | POA: Insufficient documentation

## 2024-02-26 DIAGNOSIS — Z801 Family history of malignant neoplasm of trachea, bronchus and lung: Secondary | ICD-10-CM | POA: Insufficient documentation

## 2024-02-26 DIAGNOSIS — Z1509 Genetic susceptibility to other malignant neoplasm: Secondary | ICD-10-CM | POA: Insufficient documentation

## 2024-02-26 DIAGNOSIS — Z1501 Genetic susceptibility to malignant neoplasm of breast: Secondary | ICD-10-CM | POA: Diagnosis present

## 2024-02-26 DIAGNOSIS — Z8042 Family history of malignant neoplasm of prostate: Secondary | ICD-10-CM | POA: Insufficient documentation

## 2024-02-26 NOTE — Telephone Encounter (Signed)
 Referrals and all supporting documents faxed to [facility] and Dr Marieta Shorten.

## 2024-02-26 NOTE — Telephone Encounter (Signed)
-----   Message from [place] sent at 02/26/2024  3:15 PM EDT ----- Rice Chamorro  Can we fax a referral to [facility] [place] [facility] for this pt. She has BRCA 2 and is considering surgery. We also need to fax a referral to plastic surgery, Dr Marieta Shorten, both referrals have been placed.  Thanks,

## 2024-02-26 NOTE — Assessment & Plan Note (Addendum)
 This is a very pleasant 48 yr old female pt referred to high risk clinic for BRCA 2 mutation.  BRCA2 gene mutation: I discussed with the patient that BRCA2 is a tumor suppressor gene which helps repair damaged DNA or destroy cells if they cannot be repaired. In patients with BRCA1 or 2 mutations, the damaged DNA could not be repaired properly increasing the risk of cancers. BRCA2 gene is located on long arm of chromosome 13. These mutations are inherited in autosomal dominant fashion and hence 50% probability that their children may have a BRCA mutation.  Cancer risk:  Average to risk of cancer by age of 32: (Antoniou et al pooled pedigree data from 36 studies of 8139 index patients with breast or ovarian cancer) Breast cancer risk: 45 percent (95% CI, 33 to 54 percent) Ovarian cancer risk: 11 percent (95% CI, 4.1 to 18 percent)  [place] study: Tumor limited to risk by age of 51: Breast cancer risk: 55 percent (95% CI, 41 to 70 percent) Ovarian cancer risk: 16.5 percent (95% CI, 7.5 to 34 percent)  I discussed the difference between BRCA1 and BRCA2 wherein patients BRCA1 patients have early onset disease and much higher risk of breast cancer. The mean age of diagnosis of breast cancer between BRCA1 and 2 are 31 versus 47 years, risk of ovarian cancer is also higher with BRCA1 but the overall risk under the age of 39 is very low.  Other cancer risks:  1. Pancreatic cancer (relative risk is 3.51) with incidence of 4.9% in BRCA2 carriers 2. Fallopian tube carcinoma and primary peritoneal carcinoma 3. Uterine papillary serous carcinoma: Overall risk is very low 4. Colorectal cancers: In many studies there was no increased risk But there may be some for BRCA1 carriers 5. Melanoma and other skin cancers: The risk of oatmeal melanoma is increase in BRCA2 mutation carriers but it still extremely rare. 6. Endometrial cancer: Not very clear in terms of risks it is thought to be very low  Breast cancer risk  reduction/surveillance: 1. Annual mammogram and breast MRI are recommended by [facility] starting at age of 58-30 2. Chemoprevention with tamoxifen-like agents is not entirely clear. Based on NSABP P1 study in the small cohort of patients with BRCA1 mutations, tamoxifen to reduce breast cancer risk by 62% and BRCA2 carriers but not in BRCA1 carriers. The study is limited because of small numbers. I do not recommend it primarily because most commonly patients with BRCA mutations tend to have triple negative disease. 3. After childbearing, evaluation for prophylactic bilateral mastectomy is an option. 4. Oophorectomy self reduces the risk of breast cancer by 50% 5. Breast self-examinations starting at age 46   Ovarian cancer risk reduction: 1. Risk reducing bilateral salpingo-oophorectomy between the ages of 96-40 once childbearing is complete is recommended. In one study that was 72% reduction of risk of ovarian cancer and a decrease in breast cancer by approximately 50% 2. There is no clear data for surveillance with CA125 or vaginal ultrasounds 2. Oral contraception dose may be protective against ovarian cancer but it may slightly increase risk of breast cancer.  She is agreeable to referral to breast surgery, plastic surgery for mastectomy. We will refer her to GYN ONC after her breast surgery.

## 2024-02-26 NOTE — Progress Notes (Signed)
 [facility] CONSULT NOTE  Patient Care Team: Jonathon Neighbors, MD as PCP - General (Family Medicine)  CHIEF COMPLAINTS/PURPOSE OF CONSULTATION:  BRCA 2 pos  ASSESSMENT & PLAN:  BRCA2 gene mutation positive This is a very pleasant 48 yr old female pt referred to high risk clinic for BRCA 2 mutation.  BRCA2 gene mutation: I discussed with the patient that BRCA2 is a tumor suppressor gene which helps repair damaged DNA or destroy cells if they cannot be repaired. In patients with BRCA1 or 2 mutations, the damaged DNA could not be repaired properly increasing the risk of cancers. BRCA2 gene is located on long arm of chromosome 13. These mutations are inherited in autosomal dominant fashion and hence 50% probability that their children may have a BRCA mutation.  Cancer risk:  Average to risk of cancer by age of 29: (Antoniou et al pooled pedigree data from 8 studies of 8139 index patients with breast or ovarian cancer) Breast cancer risk: 45 percent (95% CI, 33 to 54 percent) Ovarian cancer risk: 11 percent (95% CI, 4.1 to 18 percent)  [place] study: Tumor limited to risk by age of 49: Breast cancer risk: 55 percent (95% CI, 41 to 70 percent) Ovarian cancer risk: 16.5 percent (95% CI, 7.5 to 34 percent)  I discussed the difference between BRCA1 and BRCA2 wherein patients BRCA1 patients have early onset disease and much higher risk of breast cancer. The mean age of diagnosis of breast cancer between BRCA1 and 2 are 50 versus 47 years, risk of ovarian cancer is also higher with BRCA1 but the overall risk under the age of 54 is very low.  Other cancer risks:  1. Pancreatic cancer (relative risk is 3.51) with incidence of 4.9% in BRCA2 carriers 2. Fallopian tube carcinoma and primary peritoneal carcinoma 3. Uterine papillary serous carcinoma: Overall risk is very low 4. Colorectal cancers: In many studies there was no increased risk But there may be some for BRCA1 carriers 5. Melanoma  and other skin cancers: The risk of oatmeal melanoma is increase in BRCA2 mutation carriers but it still extremely rare. 6. Endometrial cancer: Not very clear in terms of risks it is thought to be very low  Breast cancer risk reduction/surveillance: 1. Annual mammogram and breast MRI are recommended by NCCN starting at age of 55-30 2. Chemoprevention with tamoxifen-like agents is not entirely clear. Based on NSABP P1 study in the small cohort of patients with BRCA1 mutations, tamoxifen to reduce breast cancer risk by 62% and BRCA2 carriers but not in BRCA1 carriers. The study is limited because of small numbers. I do not recommend it primarily because most commonly patients with BRCA mutations tend to have triple negative disease. 3. After childbearing, evaluation for prophylactic bilateral mastectomy is an option. 4. Oophorectomy self reduces the risk of breast cancer by 50% 5. Breast self-examinations starting at age 48   Ovarian cancer risk reduction: 1. Risk reducing bilateral salpingo-oophorectomy between the ages of 6-40 once childbearing is complete is recommended. In one study that was 72% reduction of risk of ovarian cancer and a decrease in breast cancer by approximately 50% 2. There is no clear data for surveillance with CA125 or vaginal ultrasounds 2. Oral contraception dose may be protective against ovarian cancer but it may slightly increase risk of breast cancer.  She is agreeable to referral to breast surgery, plastic surgery for mastectomy. We will refer her to GYN ONC after her breast surgery.    No orders of the  defined types were placed in this encounter.    HISTORY OF PRESENTING ILLNESS:  Rita Haynes 48 y.o. female is here because of BRCA positivity.  Discussed the use of AI scribe software for clinical note transcription with the patient, who gave verbal consent to proceed.  History of Present Illness Rita Haynes is a 48 year old female with a BRCA2  mutation who presents for genetic counseling and management options.  She has a confirmed BRCA2 mutation, initially discovered in her family after her sister underwent genetic testing following a concerning mammogram. Her sister tested positive for the mutation, prompting her and her other sister to get tested. She and one of her sisters tested positive, while her other sister and daughter tested negative.  Her family history is significant for breast cancer on her mother's side, with two aunts who passed away in their fifties from the disease. Her father's side has a history of prostate cancer, with several of her father's brothers affected. Her father had prostate cancer and is currently in remission after radiation treatment. Additionally, her maternal grandmother had pancreatic cancer, and her maternal grandfather had melanoma.  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 She has not experienced any significant accidents or trauma to her back. She also recently had a colonoscopy.  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 She describes her work as not stressful and enjoys her role. She does not smoke or drink alcohol. Her daughter, who is 25 years old, is scheduled for surgery to remove a non-cancerous growth on May 15th.  No current symptoms related to breast cancer or other malignancies.   All other systems were reviewed with the patient and are negative.  MEDICAL HISTORY:  Past Medical History:  Diagnosis Date   BRCA2 gene mutation positive 01/29/2024   Hypertension     SURGICAL HISTORY: No past surgical history on file.  SOCIAL HISTORY: Social History   Socioeconomic History   Marital status: Legally Separated    Spouse name: Not on file   Number of children: Not on file   Years  of education: Not on file   Highest education level: Not on file  Occupational History   Not on file  Tobacco Use   Smoking status: Never   Smokeless tobacco: Not on file  Substance and Sexual Activity   Alcohol use: No   Drug use: No   Sexual activity: Not on file  Other Topics Concern   Not on file  Social History Narrative   Not on file   Social Drivers of Health   Financial Resource Strain: Not on file  Food Insecurity: Not on file  Transportation Needs: Not on file  Physical Activity: Not on file  Stress: Not on file  Social Connections: Not on file  Intimate Partner Violence: Not on file    FAMILY HISTORY: Family History  Problem Relation Age of Onset   Prostate cancer Father 42   BRCA 1/2 Sister        BRCA2 positive   Breast cancer Maternal Aunt 42   Breast cancer Maternal Aunt 52   Prostate cancer Paternal Uncle 60       mets   Lung cancer Paternal Uncle    Pancreatic cancer Maternal Grandmother 28   Multiple myeloma Maternal Grandfather 41  Prostate cancer Paternal Grandfather 52       mets    ALLERGIES:  is allergic to amoxicillin and cefuroxime.  MEDICATIONS:  Current Outpatient Medications  Medication Sig Dispense Refill   prednisoLONE acetate (PRED FORTE) 1 % ophthalmic suspension Place 1 drop into the left eye every 2 (two) hours while awake.     sulfamethoxazole -trimethoprim  (BACTRIM  DS,SEPTRA  DS) 800-160 MG tablet Take 1 tablet by mouth 2 (two) times daily.     tobramycin-dexamethasone (TOBRADEX) ophthalmic ointment Place 1 application into the left eye at bedtime.     No current facility-administered medications for this visit.     PHYSICAL EXAMINATION: ECOG PERFORMANCE STATUS: 0 - Asymptomatic  Vitals:   02/26/24 1421  BP: 125/71  Pulse: 89  Resp: 16  Temp: 98.1 F (36.7 C)  SpO2: 100%   Filed Weights   02/26/24 1421  Weight: 227 lb 8 oz (103.2 kg)    GENERAL:alert, no distress and comfortable Bilateral breasts examined.  Large pendulous breast. No palpable masses. No regional adenopathy.  LABORATORY DATA:  I have reviewed the data as listed Lab Results  Component Value Date   WBC 10.4 10/15/2018   HGB 11.5 (L) 10/15/2018   HCT 35.6 (L) 10/15/2018   MCV 83.2 10/15/2018   PLT 360 10/15/2018     Chemistry      Component Value Date/Time   NA 131 (L) 10/15/2018 0634   K 5.1 10/15/2018 0634   CL 104 10/15/2018 0634   CO2 21 (L) 10/15/2018 0634   BUN 17 10/15/2018 0634   CREATININE 1.31 (H) 10/15/2018 0634      Component Value Date/Time   CALCIUM 8.5 (L) 10/15/2018 0634   ALKPHOS 53 10/15/2018 0634   AST 19 10/15/2018 0634   ALT 10 10/15/2018 0634   BILITOT 0.6 10/15/2018 0634       RADIOGRAPHIC STUDIES: I have personally reviewed the radiological images as listed and agreed with the findings in the report. No results found.  All questions were answered. The patient knows to call the clinic with any problems, questions or concerns. I spent 45 minutes in the care of this patient including H and P, review of records, counseling and coordination of care.     Murleen Arms, MD 02/26/2024 3:13 PM

## 2024-03-24 ENCOUNTER — Telehealth: Payer: Self-pay | Admitting: Genetic Counselor

## 2024-03-24 NOTE — Telephone Encounter (Signed)
 Reviewed downgrade of BRCA2 VUS ( p.H150R) to likely benign in person when she attended genetics appt with her daughter.  Rita Haynes still has a mutation in BRCA2 ([id number]).  Recommended continued BRCA2-related management given mutation.

## 2024-04-02 ENCOUNTER — Other Ambulatory Visit: Payer: Self-pay | Admitting: General Surgery

## 2024-04-02 DIAGNOSIS — Z1501 Genetic susceptibility to malignant neoplasm of breast: Secondary | ICD-10-CM

## 2024-05-09 ENCOUNTER — Ambulatory Visit
Admission: RE | Admit: 2024-05-09 | Discharge: 2024-05-09 | Disposition: A | Discharge status: Home / Self Care | Source: Ambulatory Visit | Attending: General Surgery | Admitting: General Surgery

## 2024-05-09 DIAGNOSIS — Z1501 Genetic susceptibility to malignant neoplasm of breast: Secondary | ICD-10-CM

## 2024-05-09 MED ORDER — GADOPICLENOL 0.5 MMOL/ML IV SOLN
10.0000 mL | Freq: Once | INTRAVENOUS | Status: AC | PRN
  Administered 2024-05-09: 10 mL via INTRAVENOUS

## 2024-05-26 NOTE — H&P (Signed)
 Subjective Patient ID: Rita Haynes is a 48 y.o. female.     HPI   Returns for follow up discussion breast reconstruction. Patient diagnosed 2025 with BRCA2 mutation following diagnosis in sister of BRCA2 mutation. Her sister is my patient. Two PA with breast cancer. Since last visit she has established with Dr. Ebbie.    MMG 12/2023 [facility] normal. MRI 05/2024 benign.   Current DD   Works as Conservator, museum/gallery. Lives with two of her 3 kids ages 17 and 5, and twin 2 yo grandchildren.    Review of Systems  Constitutional:  Positive for unexpected weight change.  Musculoskeletal:  Positive for back pain.  Allergic/Immunologic: Positive for environmental allergies.  Psychiatric/Behavioral:  Positive for dysphoric mood. The patient is nervous/anxious.   All other systems reviewed and are negative.    Objective Physical Exam  Cardiovascular: Normal rate, regular rhythm and normal heart sounds.    Pulmonary/Chest Effort normal and breath sounds normal.    Skin   Fitzpatrick 6     Lymph: no palpable axillary adenopathy   Breasts: no palpable masses grade 3 ptosis bilateral Right>left volume SN to nipple R 40.5 L 38 cm BW R 22 L 20 cm Nipple to IMF R 18 L 13 cm   Assessment/Plan Diagnoses and all orders for this visit:   BRCA2 gene mutation positive     Plan staged approach to breast reconstruction with breast reduction to allow for NSM.    Reviewed reduction with anchor type scars, OP surgery, drains, post operative visits and limitations, recovery. Diminished sensation nipple and breast skin, risk of nipple loss, wound healing problems, asymmetry, incidental carcinoma, changes with wt gain/loss, aging, unacceptable cosmetic appearance reviewed.   With staged approach, this would mean at least 3 surgeries to get to implant placement. Reviewed my preference at time of mastectomy, if pursuing implant based, to place expanders over permanent implants.  Counseled expander placement at time of mastectomy does not preclude future autologous reconstruction. If she desired NSM, could place tissue expanders locally then pursue DIEP or similar in future.   Reviewed possiblility FNG if any compromise noted intraoperatively. Additional risks including but not limited to bleeding hematoma seroma damage to adjacent structures need for additional procedures blood clots in legs or lungs infection reviewed. Completed ASPS consent breast reduction.   Drain teaching completed. Rx for tramadol given.   Earlis Ranks, MD [facility]  Office/ physician access line after hours [phone]

## 2024-05-31 ENCOUNTER — Encounter (HOSPITAL_BASED_OUTPATIENT_CLINIC_OR_DEPARTMENT_OTHER): Payer: Self-pay | Admitting: Plastic Surgery

## 2024-05-31 ENCOUNTER — Encounter (HOSPITAL_BASED_OUTPATIENT_CLINIC_OR_DEPARTMENT_OTHER)
Admission: RE | Admit: 2024-05-31 | Discharge: 2024-05-31 | Disposition: A | Discharge status: Home / Self Care | Source: Ambulatory Visit | Attending: Plastic Surgery | Admitting: Plastic Surgery

## 2024-05-31 ENCOUNTER — Other Ambulatory Visit: Payer: Self-pay

## 2024-05-31 DIAGNOSIS — Z01810 Encounter for preprocedural cardiovascular examination: Secondary | ICD-10-CM | POA: Insufficient documentation

## 2024-05-31 DIAGNOSIS — I10 Essential (primary) hypertension: Secondary | ICD-10-CM | POA: Diagnosis not present

## 2024-05-31 MED ORDER — CHLORHEXIDINE GLUCONATE CLOTH 2 % EX PADS: 6.0000 | MEDICATED_PAD | Freq: Once | CUTANEOUS | Status: DC

## 2024-05-31 NOTE — Progress Notes (Signed)

## 2024-06-04 ENCOUNTER — Ambulatory Visit (HOSPITAL_BASED_OUTPATIENT_CLINIC_OR_DEPARTMENT_OTHER): Admitting: Anesthesiology

## 2024-06-04 ENCOUNTER — Encounter (HOSPITAL_BASED_OUTPATIENT_CLINIC_OR_DEPARTMENT_OTHER): Payer: Self-pay | Admitting: Plastic Surgery

## 2024-06-04 ENCOUNTER — Other Ambulatory Visit: Payer: Self-pay

## 2024-06-04 ENCOUNTER — Ambulatory Visit (HOSPITAL_BASED_OUTPATIENT_CLINIC_OR_DEPARTMENT_OTHER)
Admission: RE | Admit: 2024-06-04 | Discharge: 2024-06-04 | Disposition: A | Discharge status: Home / Self Care | Attending: Plastic Surgery | Admitting: Plastic Surgery

## 2024-06-04 ENCOUNTER — Encounter (HOSPITAL_BASED_OUTPATIENT_CLINIC_OR_DEPARTMENT_OTHER)
Admission: RE | Disposition: A | Discharge status: Home / Self Care | Payer: Self-pay | Source: Home / Self Care | Attending: Plastic Surgery

## 2024-06-04 DIAGNOSIS — F32A Depression, unspecified: Secondary | ICD-10-CM | POA: Diagnosis not present

## 2024-06-04 DIAGNOSIS — Z803 Family history of malignant neoplasm of breast: Secondary | ICD-10-CM | POA: Diagnosis not present

## 2024-06-04 DIAGNOSIS — D649 Anemia, unspecified: Secondary | ICD-10-CM | POA: Insufficient documentation

## 2024-06-04 DIAGNOSIS — M549 Dorsalgia, unspecified: Secondary | ICD-10-CM | POA: Diagnosis not present

## 2024-06-04 DIAGNOSIS — Z1509 Genetic susceptibility to other malignant neoplasm: Secondary | ICD-10-CM | POA: Diagnosis present

## 2024-06-04 DIAGNOSIS — Z1501 Genetic susceptibility to malignant neoplasm of breast: Secondary | ICD-10-CM | POA: Insufficient documentation

## 2024-06-04 DIAGNOSIS — I10 Essential (primary) hypertension: Secondary | ICD-10-CM | POA: Insufficient documentation

## 2024-06-04 DIAGNOSIS — N62 Hypertrophy of breast: Secondary | ICD-10-CM | POA: Diagnosis present

## 2024-06-04 DIAGNOSIS — Z01818 Encounter for other preprocedural examination: Secondary | ICD-10-CM

## 2024-06-04 HISTORY — DX: Anemia, unspecified: D64.9

## 2024-06-04 HISTORY — DX: Depression, unspecified: F32.A

## 2024-06-04 LAB — POCT PREGNANCY, URINE: Preg Test, Ur: NEGATIVE

## 2024-06-04 SURGERY — MAMMOPLASTY, REDUCTION
Anesthesia: General | Site: Breast | Laterality: Bilateral

## 2024-06-04 MED ORDER — OXYCODONE HCL 5 MG PO TABS
ORAL_TABLET | ORAL | Status: AC
Start: 2024-06-04 — End: 2024-06-04
  Filled 2024-06-04: qty 1

## 2024-06-04 MED ORDER — ACETAMINOPHEN 500 MG PO TABS
ORAL_TABLET | ORAL | Status: AC
  Filled 2024-06-04: qty 2

## 2024-06-04 MED ORDER — HYDROMORPHONE HCL 1 MG/ML IJ SOLN
INTRAMUSCULAR | Status: DC | PRN
  Administered 2024-06-04: .5 mg via INTRAVENOUS

## 2024-06-04 MED ORDER — CIPROFLOXACIN IN D5W 400 MG/200ML IV SOLN
INTRAVENOUS | Status: AC
  Filled 2024-06-04: qty 200

## 2024-06-04 MED ORDER — DROPERIDOL 2.5 MG/ML IJ SOLN: 0.6250 mg | Freq: Once | INTRAMUSCULAR | Status: DC | PRN

## 2024-06-04 MED ORDER — MIDAZOLAM HCL 5 MG/5ML IJ SOLN
INTRAMUSCULAR | Status: DC | PRN
Start: 2024-06-04 — End: 2024-06-04
  Administered 2024-06-04: 2 mg via INTRAVENOUS

## 2024-06-04 MED ORDER — GABAPENTIN 300 MG PO CAPS
300.0000 mg | ORAL_CAPSULE | ORAL | Status: AC
  Administered 2024-06-04: 300 mg via ORAL

## 2024-06-04 MED ORDER — PROPOFOL 10 MG/ML IV BOLUS
INTRAVENOUS | Status: AC
  Filled 2024-06-04: qty 20

## 2024-06-04 MED ORDER — HYDROMORPHONE HCL 1 MG/ML IJ SOLN
INTRAMUSCULAR | Status: AC
  Filled 2024-06-04: qty 0.5

## 2024-06-04 MED ORDER — ACETAMINOPHEN 500 MG PO TABS
1000.0000 mg | ORAL_TABLET | ORAL | Status: AC
  Administered 2024-06-04: 1000 mg via ORAL

## 2024-06-04 MED ORDER — BUPIVACAINE HCL (PF) 0.5 % IJ SOLN
INTRAMUSCULAR | Status: AC
  Filled 2024-06-04: qty 30

## 2024-06-04 MED ORDER — ROCURONIUM BROMIDE 100 MG/10ML IV SOLN
INTRAVENOUS | Status: DC | PRN
Start: 2024-06-04 — End: 2024-06-04
  Administered 2024-06-04: 60 mg via INTRAVENOUS

## 2024-06-04 MED ORDER — ONDANSETRON HCL 4 MG/2ML IJ SOLN
INTRAMUSCULAR | Status: DC | PRN
  Administered 2024-06-04: 4 mg via INTRAVENOUS

## 2024-06-04 MED ORDER — CELECOXIB 200 MG PO CAPS
ORAL_CAPSULE | ORAL | Status: AC
  Filled 2024-06-04: qty 1

## 2024-06-04 MED ORDER — CELECOXIB 200 MG PO CAPS
200.0000 mg | ORAL_CAPSULE | ORAL | Status: AC
  Administered 2024-06-04: 200 mg via ORAL

## 2024-06-04 MED ORDER — EPHEDRINE 5 MG/ML INJ
INTRAVENOUS | Status: AC
  Filled 2024-06-04: qty 5

## 2024-06-04 MED ORDER — FENTANYL CITRATE (PF) 100 MCG/2ML IJ SOLN
INTRAMUSCULAR | Status: DC | PRN
  Administered 2024-06-04: 100 ug via INTRAVENOUS
  Administered 2024-06-04: 25 ug via INTRAVENOUS
  Administered 2024-06-04: 50 ug via INTRAVENOUS
  Administered 2024-06-04: 25 ug via INTRAVENOUS

## 2024-06-04 MED ORDER — OXYCODONE HCL 5 MG PO TABS
5.0000 mg | ORAL_TABLET | Freq: Once | ORAL | Status: AC
  Administered 2024-06-04: 5 mg via ORAL

## 2024-06-04 MED ORDER — BUPIVACAINE HCL (PF) 0.5 % IJ SOLN
INTRAMUSCULAR | Status: DC | PRN
  Administered 2024-06-04: 30 mL

## 2024-06-04 MED ORDER — PHENYLEPHRINE 80 MCG/ML (10ML) SYRINGE FOR IV PUSH (FOR BLOOD PRESSURE SUPPORT)
PREFILLED_SYRINGE | INTRAVENOUS | Status: AC
  Filled 2024-06-04: qty 10

## 2024-06-04 MED ORDER — FENTANYL CITRATE (PF) 100 MCG/2ML IJ SOLN
INTRAMUSCULAR | Status: AC
  Filled 2024-06-04: qty 2

## 2024-06-04 MED ORDER — 0.9 % SODIUM CHLORIDE (POUR BTL) OPTIME
TOPICAL | Status: DC | PRN
  Administered 2024-06-04: 200 mL

## 2024-06-04 MED ORDER — LIDOCAINE HCL (CARDIAC) PF 100 MG/5ML IV SOSY
PREFILLED_SYRINGE | INTRAVENOUS | Status: DC | PRN
  Administered 2024-06-04: 100 mg via INTRAVENOUS

## 2024-06-04 MED ORDER — DEXAMETHASONE SODIUM PHOSPHATE 4 MG/ML IJ SOLN
INTRAMUSCULAR | Status: DC | PRN
  Administered 2024-06-04: 5 mg via INTRAVENOUS

## 2024-06-04 MED ORDER — SCOPOLAMINE 1 MG/3DAYS TD PT72
1.0000 | MEDICATED_PATCH | Freq: Once | TRANSDERMAL | Status: DC
  Administered 2024-06-04: 1.5 mg via TRANSDERMAL

## 2024-06-04 MED ORDER — SCOPOLAMINE 1 MG/3DAYS TD PT72
MEDICATED_PATCH | TRANSDERMAL | Status: AC
  Filled 2024-06-04: qty 1

## 2024-06-04 MED ORDER — EPHEDRINE SULFATE (PRESSORS) 50 MG/ML IJ SOLN
INTRAMUSCULAR | Status: DC | PRN
  Administered 2024-06-04: 10 mg via INTRAVENOUS
  Administered 2024-06-04: 5 mg via INTRAVENOUS
  Administered 2024-06-04: 10 mg via INTRAVENOUS

## 2024-06-04 MED ORDER — MIDAZOLAM HCL 2 MG/2ML IJ SOLN
INTRAMUSCULAR | Status: AC
  Filled 2024-06-04: qty 2

## 2024-06-04 MED ORDER — LACTATED RINGERS IV SOLN: INTRAVENOUS | Status: DC

## 2024-06-04 MED ORDER — SUGAMMADEX SODIUM 200 MG/2ML IV SOLN
INTRAVENOUS | Status: DC | PRN
Start: 2024-06-04 — End: 2024-06-04
  Administered 2024-06-04: 200 mg via INTRAVENOUS

## 2024-06-04 MED ORDER — LIDOCAINE 2% (20 MG/ML) 5 ML SYRINGE
INTRAMUSCULAR | Status: AC
  Filled 2024-06-04: qty 5

## 2024-06-04 MED ORDER — ROCURONIUM BROMIDE 10 MG/ML (PF) SYRINGE
PREFILLED_SYRINGE | INTRAVENOUS | Status: AC
  Filled 2024-06-04: qty 10

## 2024-06-04 MED ORDER — HYDROMORPHONE HCL 1 MG/ML IJ SOLN
0.2500 mg | INTRAMUSCULAR | Status: DC | PRN
  Administered 2024-06-04 (×2): 0.5 mg via INTRAVENOUS

## 2024-06-04 MED ORDER — GABAPENTIN 300 MG PO CAPS
ORAL_CAPSULE | ORAL | Status: AC
  Filled 2024-06-04: qty 1

## 2024-06-04 MED ORDER — PROPOFOL 10 MG/ML IV BOLUS
INTRAVENOUS | Status: DC | PRN
Start: 2024-06-04 — End: 2024-06-04
  Administered 2024-06-04: 200 mg via INTRAVENOUS

## 2024-06-04 MED ORDER — DEXAMETHASONE SODIUM PHOSPHATE 10 MG/ML IJ SOLN
INTRAMUSCULAR | Status: AC
  Filled 2024-06-04: qty 1

## 2024-06-04 MED ORDER — PHENYLEPHRINE HCL (PRESSORS) 10 MG/ML IV SOLN
INTRAVENOUS | Status: DC | PRN
  Administered 2024-06-04 (×3): 80 ug via INTRAVENOUS

## 2024-06-04 MED ORDER — FENTANYL CITRATE (PF) 100 MCG/2ML IJ SOLN
INTRAMUSCULAR | Status: AC
Start: 2024-06-04 — End: 2024-06-04
  Filled 2024-06-04: qty 2

## 2024-06-04 MED ORDER — CIPROFLOXACIN IN D5W 400 MG/200ML IV SOLN
400.0000 mg | INTRAVENOUS | Status: AC
  Administered 2024-06-04: 400 mg via INTRAVENOUS

## 2024-06-04 SURGICAL SUPPLY — 44 items
BINDER BREAST 3XL (GAUZE/BANDAGES/DRESSINGS) IMPLANT
BINDER BREAST LRG (GAUZE/BANDAGES/DRESSINGS) IMPLANT
BINDER BREAST MEDIUM (GAUZE/BANDAGES/DRESSINGS) IMPLANT
BINDER BREAST XLRG (GAUZE/BANDAGES/DRESSINGS) IMPLANT
BINDER BREAST XXLRG (GAUZE/BANDAGES/DRESSINGS) IMPLANT
BLADE SURG 10 STRL SS (BLADE) ×4 IMPLANT
BNDG GAUZE DERMACEA FLUFF 4 (GAUZE/BANDAGES/DRESSINGS) ×2 IMPLANT
CANISTER SUCT 1200ML W/VALVE (MISCELLANEOUS) ×1 IMPLANT
CHLORAPREP W/TINT 26 (MISCELLANEOUS) ×2 IMPLANT
COVER BACK TABLE 60X90IN (DRAPES) ×1 IMPLANT
COVER MAYO STAND STRL (DRAPES) ×1 IMPLANT
DERMABOND ADVANCED .7 DNX12 (GAUZE/BANDAGES/DRESSINGS) ×2 IMPLANT
DRAIN CHANNEL 15F RND FF W/TCR (WOUND CARE) IMPLANT
DRAIN CHANNEL 19F RND (DRAIN) IMPLANT
DRAPE TOP ARMCOVERS (MISCELLANEOUS) ×1 IMPLANT
DRAPE U-SHAPE 76X120 STRL (DRAPES) ×1 IMPLANT
DRAPE UTILITY XL STRL (DRAPES) ×1 IMPLANT
ELECT COATED BLADE 2.86 ST (ELECTRODE) ×1 IMPLANT
ELECTRODE REM PT RTRN 9FT ADLT (ELECTROSURGICAL) ×1 IMPLANT
EVACUATOR SILICONE 100CC (DRAIN) IMPLANT
GAUZE PAD ABD 8X10 STRL (GAUZE/BANDAGES/DRESSINGS) ×2 IMPLANT
GLOVE BIO SURGEON STRL SZ 6 (GLOVE) ×2 IMPLANT
GOWN STRL REUS W/ TWL LRG LVL3 (GOWN DISPOSABLE) ×2 IMPLANT
MARKER SKIN DUAL TIP RULER LAB (MISCELLANEOUS) IMPLANT
NDL HYPO 25X1 1.5 SAFETY (NEEDLE) ×1 IMPLANT
NEEDLE HYPO 25X1 1.5 SAFETY (NEEDLE) ×1 IMPLANT
NS IRRIG 1000ML POUR BTL (IV SOLUTION) ×1 IMPLANT
PACK BASIN DAY SURGERY FS (CUSTOM PROCEDURE TRAY) ×1 IMPLANT
PENCIL SMOKE EVACUATOR (MISCELLANEOUS) ×1 IMPLANT
PIN SAFETY STERILE (MISCELLANEOUS) ×1 IMPLANT
SHEET MEDIUM DRAPE 40X70 STRL (DRAPES) ×1 IMPLANT
SLEEVE SCD COMPRESS KNEE MED (STOCKING) ×1 IMPLANT
SPONGE T-LAP 18X18 ~~LOC~~+RFID (SPONGE) ×3 IMPLANT
STAPLER SKIN PROX WIDE 3.9 (STAPLE) ×1 IMPLANT
SUT ETHILON 2 0 FS 18 (SUTURE) IMPLANT
SUT MNCRL AB 3-0 PS2 27 (SUTURE) IMPLANT
SUT MNCRL AB 4-0 PS2 18 (SUTURE) IMPLANT
SUT VIC AB 3-0 PS1 18XBRD (SUTURE) IMPLANT
SYR BULB IRRIG 60ML STRL (SYRINGE) ×1 IMPLANT
SYR CONTROL 10ML LL (SYRINGE) ×1 IMPLANT
TOWEL GREEN STERILE FF (TOWEL DISPOSABLE) ×2 IMPLANT
TUBE CONNECTING 20X1/4 (TUBING) ×1 IMPLANT
UNDERPAD 30X36 HEAVY ABSORB (UNDERPADS AND DIAPERS) ×2 IMPLANT
YANKAUER SUCT BULB TIP NO VENT (SUCTIONS) ×1 IMPLANT

## 2024-06-04 NOTE — Anesthesia Postprocedure Evaluation (Signed)
 Anesthesia Post Note  Patient: Rita Haynes  Procedure(s) Performed: MAMMOPLASTY, REDUCTION (Bilateral: Breast)     Patient location during evaluation: PACU Anesthesia Type: General Level of consciousness: awake and alert Pain management: pain level controlled Vital Signs Assessment: post-procedure vital signs reviewed and stable Respiratory status: spontaneous breathing, nonlabored ventilation and respiratory function stable Cardiovascular status: blood pressure returned to baseline Postop Assessment: no apparent nausea or vomiting Anesthetic complications: no   No notable events documented.  Last Vitals:  Vitals:   06/04/24 1130 06/04/24 1139  BP: 134/80   Pulse: 79   Resp: 13   Temp:    SpO2: 97% 96%    Last Pain:  Vitals:   06/04/24 1136  TempSrc:   PainSc: 4                  Vertell Row

## 2024-06-04 NOTE — Anesthesia Procedure Notes (Signed)
 Procedure Name: Intubation Date/Time: 06/04/2024 7:42 AM  Performed by: Pam Macario BROCKS, CRNAPre-anesthesia Checklist: Patient identified, Emergency Drugs available, Suction available, Patient being monitored and Timeout performed Patient Re-evaluated:Patient Re-evaluated prior to induction Oxygen Delivery Method: Circle system utilized Preoxygenation: Pre-oxygenation with 100% oxygen Induction Type: IV induction Ventilation: Mask ventilation without difficulty Tube type: Oral Number of attempts: 1 Airway Equipment and Method: Stylet and Oral airway Placement Confirmation: ETT inserted through vocal cords under direct vision, positive ETCO2, breath sounds checked- equal and bilateral and CO2 detector Secured at: 23 cm Tube secured with: Tape Dental Injury: Teeth and Oropharynx as per pre-operative assessment

## 2024-06-04 NOTE — Anesthesia Preprocedure Evaluation (Addendum)
 Anesthesia Evaluation  Patient identified by MRN, date of birth, ID band Patient awake    Reviewed: Allergy & Precautions, NPO status , Patient's Chart, lab work & pertinent test results  History of Anesthesia Complications Negative for: history of anesthetic complications  Airway Mallampati: II  TM Distance: >3 FB Neck ROM: Full    Dental no notable dental hx.    Pulmonary neg pulmonary ROS   Pulmonary exam normal        Cardiovascular hypertension, Pt. on medications Normal cardiovascular exam     Neuro/Psych    Depression    negative neurological ROS     GI/Hepatic negative GI ROS, Neg liver ROS,,,  Endo/Other  negative endocrine ROS    Renal/GU negative Renal ROS     Musculoskeletal negative musculoskeletal ROS (+)    Abdominal   Peds  Hematology  (+) Blood dyscrasia, anemia   Anesthesia Other Findings Day of surgery medications reviewed with patient.  Reproductive/Obstetrics                              Anesthesia Physical Anesthesia Plan  ASA: 2  Anesthesia Plan: General   Post-op Pain Management: Tylenol  PO (pre-op)* and Celebrex PO (pre-op)*   Induction: Intravenous  PONV Risk Score and Plan: Treatment may vary due to age or medical condition, Ondansetron, Dexamethasone, Midazolam, Scopolamine patch - Pre-op and Propofol infusion  Airway Management Planned: Oral ETT  Additional Equipment: None  Intra-op Plan:   Post-operative Plan: Extubation in OR  Informed Consent: I have reviewed the patients History and Physical, chart, labs and discussed the procedure including the risks, benefits and alternatives for the proposed anesthesia with the patient or authorized representative who has indicated his/her understanding and acceptance.     Dental advisory given  Plan Discussed with: CRNA  Anesthesia Plan Comments:          Anesthesia Quick Evaluation

## 2024-06-04 NOTE — Op Note (Addendum)
 Operative Note   DATE OF OPERATION: 8.1.2025  LOCATION: [facility]-outpatient  SURGICAL DIVISION: Plastic Surgery  PREOPERATIVE DIAGNOSES:  BRCA2  POSTOPERATIVE DIAGNOSES:  same  PROCEDURE:  Bilateral breast reduction  SURGEON: Earlis Ranks MD MBA  ASSISTANT: none  ANESTHESIA:  General.   EBL: 20 ml  COMPLICATIONS: None immediate.   INDICATIONS FOR PROCEDURE:  The patient, Rita Haynes, is a 48 y.o. female born on 07-28-1976, is here for staged breast reconstruction with breast reduction planned in preparation for risk reducing nipple sparing mastectomies.   FINDINGS: Right reduction 1003 g Left reduction 635 g  DESCRIPTION OF PROCEDURE:  The patient was marked standing in the preoperative area to mark sternal notch, chest midline, anterior axillary lines, inframammary folds. The location of new nipple areolar complex was marked at level of on inframammary fold on anterior surface breast by palpation. This was marked symmetric over bilateral breasts. With aid of Wise pattern marker, location of new nipple areolar complex and vertical limbs (7 cm) were marked by displacement of breasts along meridian. The patient was taken to the operating room. SCDs were placed and IV antibiotics were given. The patient's operative site was prepped and draped in a sterile fashion. A time out was performed and all information was confirmed to be correct.     I began on left breast. Over left breast, superior medial pedicle marked and nipple areolar complex incised with 42 mm diameter marker. Pedicle deepithlialized and developed to chest wall. Pedicle developed to chest wall. Breast tissue resected over lower pole. Medial and lateral flaps developed. Additional superior and lateral breast tissue excised. Breast tailor tacked closed.   I then directed attention to right breast where superior medial pedicle designed. NAC incised with 42 mm diameter marker. The pedicle was deepithelialized.  Pedicle developed to chest wall. Breast tissue resected over lower pole. Medial and lateral flaps developed. Additional superior and lateral breast tissue excised. Breast tailor tacked closed. Patient brought to upright sitting position and assessed for symmetry. Patient returned to supine position. Breast cavities irrigated and hemostasis obtained. Local anesthetic infiltrated throughout each breast. 15 Fr JP placed in each breast and secured with 2-0 nylon. Closure completed bilateral with interrupted and short running 3-0 vicryl used to approximate dermis along remainder inframammary fold and vertical limb. NAC inset with 3-0 vicryl in dermis. Skin closure completed with 4-0 monocryl subcuticular throughout vertical limbs and NAC. Skin closure completed with 3-0 monocryl along each IMF. Tissue adhesive applied. Dry dressing and breast binder applied.  The patient was allowed to wake from anesthesia, extubated and taken to the recovery room in satisfactory condition.   SPECIMENS: right and left breast reduction  DRAINS: 15 Fr JP in right and left breast  Earlis Ranks, MD [facility]  Office/ physician access line after hours [phone]

## 2024-06-04 NOTE — Interval H&P Note (Signed)
 History and Physical Interval Note:  06/04/2024 6:56 AM  Rita Haynes  has presented today for surgery, with the diagnosis of BRCA 2.  The various methods of treatment have been discussed with the patient and family. After consideration of risks, benefits and other options for treatment, the patient has consented to  Procedure(s): MAMMOPLASTY, REDUCTION (Bilateral) as a surgical intervention.  The patient's history has been reviewed, patient examined, no change in status, stable for surgery.  I have reviewed the patient's chart and labs.  Questions were answered to the patient's satisfaction.     Rita Haynes

## 2024-06-04 NOTE — Transfer of Care (Signed)
 Immediate Anesthesia Transfer of Care Note  Patient: Suzy DELENA Saba  Procedure(s) Performed: MAMMOPLASTY, REDUCTION (Bilateral: Breast)  Patient Location: PACU  Anesthesia Type:General  Level of Consciousness: awake  Airway & Oxygen Therapy: Patient Spontanous Breathing  Post-op Assessment: Report given to RN  Post vital signs: Reviewed  Last Vitals:  Vitals Value Taken Time  BP    Temp    Pulse 83 06/04/24 10:58  Resp    SpO2 96 % 06/04/24 10:58  Vitals shown include unfiled device data.  Last Pain:  Vitals:   06/04/24 0637  TempSrc: Temporal  PainSc: 0-No pain         Complications: No notable events documented.

## 2024-06-04 NOTE — Discharge Instructions (Addendum)
  Post Anesthesia Home Care Instructions  Activity: Get plenty of rest for the remainder of the day. A responsible individual must stay with you for 24 hours following the procedure.  For the next 24 hours, DO NOT: -Drive a car -Advertising copywriter -Drink alcoholic beverages -Take any medication unless instructed by your physician -Make any legal decisions or sign important papers.  Meals: Start with liquid foods such as gelatin or soup. Progress to regular foods as tolerated. Avoid greasy, spicy, heavy foods. If nausea and/or vomiting occur, drink only clear liquids until the nausea and/or vomiting subsides. Call your physician if vomiting continues.  Special Instructions/Symptoms: Your throat may feel dry or sore from the anesthesia or the breathing tube placed in your throat during surgery. If this causes discomfort, gargle with warm salt water. The discomfort should disappear within 24 hours.  If you had a scopolamine patch placed behind your ear for the management of post- operative nausea and/or vomiting:  1. The medication in the patch is effective for 72 hours, after which it should be removed.  Wrap patch in a tissue and discard in the trash. Wash hands thoroughly with soap and water. 2. You may remove the patch earlier than 72 hours if you experience unpleasant side effects which may include dry mouth, dizziness or visual disturbances. 3. Avoid touching the patch. Wash your hands with soap and water after contact with the patch.    Next dose of tylenol  will be at 12:44pm  Next dose of ibuprofen if needed will be at 2:44pm

## 2024-06-05 ENCOUNTER — Encounter (HOSPITAL_BASED_OUTPATIENT_CLINIC_OR_DEPARTMENT_OTHER): Payer: Self-pay | Admitting: Plastic Surgery

## 2024-06-07 LAB — SURGICAL PATHOLOGY

## 2024-07-14 NOTE — Progress Notes (Signed)
 Subjective Patient ID: Ardeth Repetto is a 48 y.o. female.   HPI  6 w post op. Started DMSO POD#5 for concern ischemic changes right NAC. Went on to develop necrosis NAC and current wound care aquaphor.   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 Two PA with breast cancer.   MMG 12/2023 [facility] normal. MRI 05/2024 benign.  Prior DD. 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   Review of Systems  Objective Physical Exam   Breasts: incisions intact Left benign,  right NAC with eschar dry over 60% approximate, remainder 40 epithelialized with hypopigmentation  no cellulitis   Assessment/Plan Diagnoses and all orders for this visit:  BRCA2 gene mutation positive S/p reduction mammaplasty   Pictures today. Reports obtained Dermatology appt May 2026 for her facial eruption. Trimmed eschar. Continue Aqupahor daily.   Patient notes she plans mastectomies next summer when out of school.  F/u 2 w

## 2024-08-05 ENCOUNTER — Other Ambulatory Visit (HOSPITAL_COMMUNITY): Payer: Self-pay

## 2024-08-05 MED ORDER — DAKINS (1/4 STRENGTH) 0.125 % EX SOLN
1.0000 | Freq: Two times a day (BID) | CUTANEOUS | 0 refills | Status: DC
  Filled 2024-08-05: qty 473, 10d supply, fill #0

## 2024-08-06 ENCOUNTER — Other Ambulatory Visit (HOSPITAL_COMMUNITY): Payer: Self-pay

## 2024-08-07 ENCOUNTER — Other Ambulatory Visit (HOSPITAL_COMMUNITY): Payer: Self-pay

## 2024-08-16 NOTE — Progress Notes (Signed)
 Subjective Patient ID: Rita Haynes is a 48 y.o. female.   HPI  11 w post op. Started DMSO POD#5 for concern ischemic changes right NAC. Went on to develop necrosis NAC. Current dakin wound care. Notes less odor and drainage. Reports unable to pack wound as deeply as I did in office. Reports they have to put dog down tomorrow, has had worms and uncontrolled diarrhea.   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 Two PA with breast cancer.   MMG 12/2023 [facility] normal. MRI 05/2024 benign.  Prior DD. 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   Review of Systems  Objective Physical Exam   Breasts: Left benign,  right NAC open wound 3 x 2 cm in greatest dimensions, Tunneling present at 7 o clock up to 5 cm to area of induration lower pole Fat necrosis drained- significantly less than last visit No cellulitis Visible portions wound 100% granulated  Assessment/Plan Diagnoses and all orders for this visit:  BRCA2 gene mutation positive S/p reduction mammaplasty  Continue Dakins packing to wound twice daily. Provided supplies.   She may benefit from surgical debridement, aeration fat necrosis, and closure wound. Reviewed risks of this is recurrent wound as soft tissue is inflamed. The wound appearance and drainage from visit 5 days ago. Will continue local wound care for now. F/u 10.27.25

## 2024-08-23 ENCOUNTER — Emergency Department (HOSPITAL_COMMUNITY)
Admission: EM | Admit: 2024-08-23 | Discharge: 2024-08-23 | Disposition: A | Discharge status: Home / Self Care | Attending: Emergency Medicine | Admitting: Emergency Medicine

## 2024-08-23 ENCOUNTER — Other Ambulatory Visit: Payer: Self-pay

## 2024-08-23 ENCOUNTER — Emergency Department (HOSPITAL_COMMUNITY)

## 2024-08-23 DIAGNOSIS — R0789 Other chest pain: Secondary | ICD-10-CM | POA: Diagnosis present

## 2024-08-23 DIAGNOSIS — I10 Essential (primary) hypertension: Secondary | ICD-10-CM | POA: Diagnosis not present

## 2024-08-23 DIAGNOSIS — Z79899 Other long term (current) drug therapy: Secondary | ICD-10-CM | POA: Insufficient documentation

## 2024-08-23 DIAGNOSIS — R079 Chest pain, unspecified: Secondary | ICD-10-CM

## 2024-08-23 LAB — CBC
HCT: 31.7 % — ABNORMAL LOW (ref 36.0–46.0)
Hemoglobin: 9.8 g/dL — ABNORMAL LOW (ref 12.0–15.0)
MCH: 25.4 pg — ABNORMAL LOW (ref 26.0–34.0)
MCHC: 30.9 g/dL (ref 30.0–36.0)
MCV: 82.1 fL (ref 80.0–100.0)
Platelets: 360 K/uL (ref 150–400)
RBC: 3.86 MIL/uL — ABNORMAL LOW (ref 3.87–5.11)
RDW: 19.7 % — ABNORMAL HIGH (ref 11.5–15.5)
WBC: 5.4 K/uL (ref 4.0–10.5)
nRBC: 0 % (ref 0.0–0.2)

## 2024-08-23 LAB — BASIC METABOLIC PANEL WITH GFR
Anion gap: 8 (ref 5–15)
BUN: 6 mg/dL (ref 6–20)
CO2: 24 mmol/L (ref 22–32)
Calcium: 8.8 mg/dL — ABNORMAL LOW (ref 8.9–10.3)
Chloride: 106 mmol/L (ref 98–111)
Creatinine, Ser: 0.71 mg/dL (ref 0.44–1.00)
GFR, Estimated: >60 mL/min (ref >60)
Glucose, Bld: 111 mg/dL — ABNORMAL HIGH (ref 70–99)
Potassium: 3.7 mmol/L (ref 3.5–5.1)
Sodium: 138 mmol/L (ref 135–145)

## 2024-08-23 LAB — TROPONIN I (HIGH SENSITIVITY)
Troponin I (High Sensitivity): 3 ng/L (ref <18)
Troponin I (High Sensitivity): 3 ng/L (ref <18)

## 2024-08-23 LAB — PROTIME-INR
INR: 0.9 (ref 0.8–1.2)
Prothrombin Time: 12.7 s (ref 11.4–15.2)

## 2024-08-23 LAB — HCG, SERUM, QUALITATIVE: Preg, Serum: NEGATIVE

## 2024-08-23 MED ORDER — AMLODIPINE-OLMESARTAN 5-20 MG PO TABS: 1.0000 | ORAL_TABLET | Freq: Every day | ORAL | 3 refills | Status: AC

## 2024-08-23 MED ORDER — AMLODIPINE BESYLATE 5 MG PO TABS
5.0000 mg | ORAL_TABLET | Freq: Once | ORAL | Status: AC
  Administered 2024-08-23: 5 mg via ORAL
  Filled 2024-08-23: qty 1

## 2024-08-23 NOTE — Discharge Instructions (Signed)
 Cardiac work-up today was normal. I have sent refills to pharmacy for your BP medications, please try to make sure to take these daily. Follow-up with your primary care doctor. Return here for new concerns.

## 2024-08-23 NOTE — ED Provider Notes (Signed)
 [place] EMERGENCY DEPARTMENT AT [facility] Provider Note   CSN: [id number] Arrival date & time: 08/23/24  0206     Patient presents with: Chest Pain   SIBLE STRALEY is a 48 y.o. female.   The history is provided by the patient and medical records.  Chest Pain  48 year old female with history of hypertension, depression, anemia, presenting to the ED with chest pain.  Patient reports she has been having a lot of stress over the past few weeks.  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  She has also been out of her blood pressure medication for the past few days.  Reports tonight she was sitting on the couch and had a sensation of pressure to the center of her chest.  Did feel little short of breath temporarily.  This seems to have dissipated now.  She did have some elevated blood pressure readings at home.  He did not have any pain radiating into the arm or jaw.  She has no prior cardiac history.  She does not smoke.  Prior to Admission medications   Medication Sig Start Date End Date Taking? Authorizing Provider  amLODipine-olmesartan (AZOR) 5-20 MG tablet Take 1 tablet by mouth daily. 08/23/24  Yes Jarold Olam HERO, PA-C  citalopram (CELEXA) 40 MG tablet Take 40 mg by mouth daily.    [provider]  sodium hypochlorite (DAKIN'S 1/4 STRENGTH) 0.125 % SOLN Apply topically to wound 2 (two) times daily. 08/05/24       Allergies: Amoxicillin and Cefuroxime    Review of Systems  Cardiovascular:  Positive for chest pain.  All other systems reviewed and are negative.   Updated Vital Signs BP (!) 170/85   Pulse 68   Temp 98 F (36.7 C) (Oral)   Resp (!) 24   SpO2 100%   Physical Exam Vitals and nursing note reviewed.  Constitutional:      Appearance: She is well-developed.      Comments: Very pleasant, NAD  HENT:     Head: Normocephalic and atraumatic.  Eyes:     Conjunctiva/sclera: Conjunctivae normal.     Pupils: Pupils are equal, round, and reactive to light.  Cardiovascular:     Rate and Rhythm: Normal rate and regular rhythm.     Heart sounds: Normal heart sounds.  Pulmonary:     Effort: Pulmonary effort is normal.     Breath sounds: Normal breath sounds.  Abdominal:     General: Bowel sounds are normal.     Palpations: Abdomen is soft.  Musculoskeletal:        General: Normal range of motion.     Cervical back: Normal range of motion.  Skin:    General: Skin is warm and dry.  Neurological:     Mental Status: She is alert and oriented to person, place, and time.     (all labs ordered are listed, but only abnormal results are displayed) Labs Reviewed  BASIC METABOLIC PANEL WITH GFR - Abnormal; Notable for the following components:      Result Value   Glucose, Bld 111 (*)    Calcium 8.8 (*)    All other components within normal limits  CBC - Abnormal; Notable for the following components:   RBC 3.86 (*)    Hemoglobin 9.8 (*)  HCT 31.7 (*)    MCH 25.4 (*)    RDW 19.7 (*)    All other components within normal limits  HCG, SERUM, QUALITATIVE  PROTIME-INR  TROPONIN I (HIGH SENSITIVITY)  TROPONIN I (HIGH SENSITIVITY)    EKG: EKG Interpretation Date/Time:  Monday August 23 2024 02:21:18 EDT Ventricular Rate:  62 PR Interval:  166 QRS Duration:  82 QT Interval:  392 QTC Calculation: 397 R Axis:   17  Text Interpretation: Normal sinus rhythm Nonspecific ST abnormality Abnormal ECG When compared with ECG of 31-May-2024 13:38, PREVIOUS ECG IS PRESENT ?ST depression lead II No significant change was found Confirmed by Carita Senior [phone]) on 08/23/2024 2:51:56 AM  Radiology: DG Chest 2 View Result Date: 08/23/2024 EXAM: 2 VIEW(S) XRAY OF THE CHEST 08/23/2024 02:41:00 AM COMPARISON: None available. CLINICAL HISTORY: chest pain.  FINDINGS: LUNGS AND PLEURA: No focal pulmonary opacity. No pulmonary edema. No pleural effusion. No pneumothorax. HEART AND MEDIASTINUM: No acute abnormality of the cardiac and mediastinal silhouettes. BONES AND SOFT TISSUES: No acute osseous abnormality. IMPRESSION: 1. No acute process. Electronically signed by: Morgane Naveau MD 08/23/2024 02:59 AM EDT RP Workstation: [id number]     Procedures   Medications Ordered in the ED  amLODipine (NORVASC) tablet 5 mg (5 mg Oral Given 08/23/24 0524)                                    Medical Decision Making Amount and/or Complexity of Data Reviewed Labs: ordered. Radiology: ordered and independent interpretation performed. ECG/medicine tests: ordered and independent interpretation performed.  Risk Prescription drug management.   48 year old female presenting to the ED for chest pain.  Felt like central chest pressure without radiation.  Some shortness of breath.  Symptoms seem to have dissipated by time of my evaluation.  Does report a lot of added stress recently due to healing complications from prior breast reduction, work stresses, and dog had to be put down earlier this week.  EKG here is nonischemic.  Her labs are reassuring.  Does have an anemia which is known.  Troponin is normal.  Chest x-ray is clear.  BP is elevated, however patient has not had her blood pressure medications in the past several days as her prescription ran out.  I have given her dose of amlodipine here and sent refills to pharmacy.  She was observed on cardiac monitoring without any noted arrhythmias.  Delta troponin remains negative.  She feels reassured and is comfortable going home.  Recommended that she try to rest and relax today, stress may be playing a large factor in this.  Can follow-up closely with PCP.  Return here for new concerns.  Final diagnoses:  Chest pain in adult    ED Discharge Orders          Ordered    amLODipine-olmesartan (AZOR) 5-20 MG  tablet  Daily        08/23/24 0508               Jarold Olam HERO, PA-C 08/23/24 [id number]    Carita Senior, MD 08/23/24 [phone]

## 2024-08-23 NOTE — ED Triage Notes (Addendum)
 Patient reports central chest pain /pressure with SOB this evening , denies emesis or diaphoresis . Hypertensive at arrival .

## 2024-08-27 ENCOUNTER — Ambulatory Visit: Admitting: Hematology and Oncology

## 2024-08-30 ENCOUNTER — Ambulatory Visit: Admitting: Hematology and Oncology

## 2024-09-16 ENCOUNTER — Telehealth: Payer: Self-pay

## 2024-09-16 NOTE — Progress Notes (Signed)
 Subjective Patient ID: Rita Haynes is a 48 y.o. female.   HPI  15 w post op. Started DMSO POD#5 for concern ischemic changes right NAC. Went on to develop necrosis NAC. Current wound care collagen. Dog developed seizures acutely and had to be put down over weekend.  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 Two PA with breast cancer.   MMG 12/2023 [facility] normal. MRI 05/2024 benign.  Prior DD. Right reduction 1003 g Left reduction 635 g   Works as Conservator, [employer]. Lives with two of her 3 kids and twin 2 yo grandchildren.   Review of Systems  Objective Physical Exam   Breasts: Left benign,  right NAC open wound 1 x 0.5 cm in greatest dimensions, Tunneling present at 7 o clock up to 5 cm to area of induration lower pole Fat necrosis drained- No cellulitis Visible portions wound 100% granulated treated hypergranulation with silver nitrate  Assessment/Plan Diagnoses and all orders for this visit:  BRCA2 gene mutation positive S/p reduction mammaplasty  May stop collagen. Skin wound nearly healed. Continues to drain fat necrosis from lower pole but wound size too small to effectively pack. Dry dressing as needed F/u 3 w

## 2024-09-16 NOTE — Telephone Encounter (Signed)
 Left message on voicemail about upcoming appointment on 11/14

## 2024-09-17 ENCOUNTER — Other Ambulatory Visit: Payer: Self-pay

## 2024-09-17 ENCOUNTER — Inpatient Hospital Stay: Attending: Hematology and Oncology | Admitting: Hematology and Oncology

## 2024-09-17 VITALS — BP 120/89 | HR 85 | Temp 98.2°F | Resp 18 | Ht 67.0 in | Wt 226.0 lb

## 2024-09-17 DIAGNOSIS — Z1501 Genetic susceptibility to malignant neoplasm of breast: Secondary | ICD-10-CM

## 2024-09-17 DIAGNOSIS — Z1589 Genetic susceptibility to other disease: Secondary | ICD-10-CM

## 2024-09-17 DIAGNOSIS — L7682 Other postprocedural complications of skin and subcutaneous tissue: Secondary | ICD-10-CM

## 2024-09-17 DIAGNOSIS — Z1509 Genetic susceptibility to other malignant neoplasm: Secondary | ICD-10-CM | POA: Diagnosis not present

## 2024-09-17 DIAGNOSIS — Z15068 Genetic susceptibility to other malignant neoplasm of digestive system: Secondary | ICD-10-CM

## 2024-09-17 DIAGNOSIS — Z1507 Genetic susceptibility to malignant neoplasm of urinary tract: Secondary | ICD-10-CM

## 2024-09-17 NOTE — Progress Notes (Signed)
 [facility] CONSULT NOTE  Patient Care Team: Benjamine Aland, MD as PCP - General (Family Medicine)  CHIEF COMPLAINTS/PURPOSE OF CONSULTATION:  BRCA 2 pos  ASSESSMENT & PLAN:   Assessment and Plan Assessment & Plan Postoperative wound with persistent drainage after breast reduction Persistent drainage from postoperative wound attributed to fat, not infection. Managed with regular follow-ups and cauterization. - Continue follow-up with plastic surgeon every two weeks. - Use pads to manage drainage.  Genetic susceptibility to malignant neoplasm of breast (BRCA2 mutation) BRCA2 mutation increases breast cancer risk.  Future ovarian surgery planned post-breast issue resolution. - Order mammogram for end of January, delay if wound not healed. - Plan MRI for end of summer, contingent on healing. - Refer to gynecological oncologist for ovarian surgery discussion post-breast issue resolution. She will also benefit from annual derm visit.  HISTORY OF PRESENTING ILLNESS:  Rita Haynes 48 y.o. female is here because of BRCA positivity.  Discussed the use of AI scribe software for clinical note transcription with the patient, who gave verbal consent to proceed.  History of Present Illness  Rita Haynes is a 48 year old female with a BRCA2 mutation who presents with postoperative complications following breast reduction surgery.  She underwent breast reduction surgery on August 1st, 2025. Postoperatively, she experienced complications with one of her nipples not taking, resulting in a persistent draining hole in her chest. The drainage is sometimes foul-smelling, and the area is hard. She has been following up with her plastic surgeon every two weeks for wound management.  She has been using Bactrim  and collagen on the wound, but these treatments were stopped to focus on covering the wound. The wound has been slowly healing, with the size of the hole decreasing over time.  The drainage is significant enough to leak through her clothing, and she uses pads to manage the drainage.  She has a known BRCA2 mutation, which has influenced her surgical decisions, including the choice of nipple-sparing surgery. She has not yet undergone ovarian surgery, which is planned for the future.  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  No pain associated with the wound, but it is hard and drains continuously.    All other systems were reviewed with the patient and are negative.  MEDICAL HISTORY:  Past Medical History:  Diagnosis Date   Anemia    BRCA2 gene mutation positive 01/29/2024   Depression    Hypertension     SURGICAL HISTORY: Past Surgical History:  Procedure Laterality Date   BACK SURGERY     BREAST REDUCTION SURGERY Bilateral 06/04/2024   Procedure: MAMMOPLASTY, REDUCTION;  Surgeon: Arelia Filippo, MD;  Location: [facility];  Service: Plastics;  Laterality: Bilateral;    SOCIAL HISTORY: Social History   Socioeconomic History   Marital status: Legally Separated    Spouse name: Not on file   Number of children: Not on file   Years of education: Not on file   Highest education level: Not on file  Occupational History   Not on file  Tobacco Use   Smoking status: Never   Smokeless tobacco: Not on file  Substance and Sexual Activity   Alcohol use: No   Drug use: No   Sexual activity: Not on file  Other Topics Concern   Not on file  Social History Narrative   Not on file   Social  Drivers of Corporate Investment Banker Strain: Not on file  Food Insecurity: Not on file  Transportation Needs: Not on file  Physical Activity: Not on file  Stress: Not on file  Social Connections: Not on file  Intimate Partner Violence: Not on file    FAMILY HISTORY: Family History  Problem Relation Age of Onset   Prostate  cancer Father 24   BRCA 1/2 Sister        BRCA2 positive   Breast cancer Maternal Aunt 30   Breast cancer Maternal Aunt 52   Prostate cancer Paternal Uncle 60       mets   Lung cancer Paternal Uncle    Pancreatic cancer Maternal Grandmother 6   Multiple myeloma Maternal Grandfather 93   Prostate cancer Paternal Grandfather 8       mets    ALLERGIES:  is allergic to amoxicillin, cefuroxime, and sulfamethoxazole -trimethoprim .  MEDICATIONS:  Current Outpatient Medications  Medication Sig Dispense Refill   lisdexamfetamine (VYVANSE) 20 MG capsule Take 20 mg by mouth.     amLODipine-olmesartan (AZOR) 5-20 MG tablet Take 1 tablet by mouth daily. 30 tablet 3   citalopram (CELEXA) 40 MG tablet Take 40 mg by mouth daily.     No current facility-administered medications for this visit.     PHYSICAL EXAMINATION: ECOG PERFORMANCE STATUS: 0 - Asymptomatic  Vitals:   09/17/24 1112  BP: 120/89  Pulse: 85  Resp: 18  Temp: 98.2 F (36.8 C)  SpO2: 100%   Filed Weights   09/17/24 1112  Weight: 226 lb (102.5 kg)    GENERAL:alert, no distress and comfortable S/p reduction mammoplasty. Right breast with an open wound in the area of nipple with copious amounts of yellow drainage, no foul smell noted today. Left breast appears healthy CTA bilaterally RRR No LE edema.  LABORATORY DATA:  I have reviewed the data as listed Lab Results  Component Value Date   WBC 5.4 08/23/2024   HGB 9.8 (L) 08/23/2024   HCT 31.7 (L) 08/23/2024   MCV 82.1 08/23/2024   PLT 360 08/23/2024     Chemistry      Component Value Date/Time   NA 138 08/23/2024 0237   K 3.7 08/23/2024 0237   CL 106 08/23/2024 0237   CO2 24 08/23/2024 0237   BUN 6 08/23/2024 0237   CREATININE 0.71 08/23/2024 0237      Component Value Date/Time   CALCIUM 8.8 (L) 08/23/2024 0237   ALKPHOS 53 10/15/2018 0634   AST 19 10/15/2018 0634   ALT 10 10/15/2018 0634   BILITOT 0.6 10/15/2018 0634       RADIOGRAPHIC  STUDIES: I have personally reviewed the radiological images as listed and agreed with the findings in the report. DG Chest 2 View Result Date: 08/23/2024 EXAM: 2 VIEW(S) XRAY OF THE CHEST 08/23/2024 02:41:00 AM COMPARISON: None available. CLINICAL HISTORY: chest pain. FINDINGS: LUNGS AND PLEURA: No focal pulmonary opacity. No pulmonary edema. No pleural effusion. No pneumothorax. HEART AND MEDIASTINUM: No acute abnormality of the cardiac and mediastinal silhouettes. BONES AND SOFT TISSUES: No acute osseous abnormality. IMPRESSION: 1. No acute process. Electronically signed by: Morgane Naveau MD 08/23/2024 02:59 AM EDT RP Workstation: [id number]    All questions were answered. The patient knows to call the clinic with any problems, questions or concerns. I spent 30 minutes in the care of this patient including H and P, review of records, counseling and coordination of care.     Amber Stalls, MD 09/17/2024  11:30 AM

## 2025-04-19 ENCOUNTER — Inpatient Hospital Stay: Attending: Hematology and Oncology | Admitting: Hematology and Oncology
